# Patient Record
Sex: Male | Born: 1965
Health system: Southern US, Community
[De-identification: ages and names within clinical notes are randomized; demographics above are authoritative.]

## PROBLEM LIST (undated history)

## (undated) DIAGNOSIS — F329 Major depressive disorder, single episode, unspecified: Secondary | ICD-10-CM

## (undated) DIAGNOSIS — K2270 Barrett's esophagus without dysplasia: Secondary | ICD-10-CM

## (undated) DIAGNOSIS — K219 Gastro-esophageal reflux disease without esophagitis: Secondary | ICD-10-CM

## (undated) DIAGNOSIS — D72829 Elevated white blood cell count, unspecified: Secondary | ICD-10-CM

## (undated) DIAGNOSIS — I2510 Atherosclerotic heart disease of native coronary artery without angina pectoris: Secondary | ICD-10-CM

## (undated) DIAGNOSIS — E785 Hyperlipidemia, unspecified: Secondary | ICD-10-CM

## (undated) DIAGNOSIS — F32A Depression, unspecified: Secondary | ICD-10-CM

## (undated) DIAGNOSIS — J449 Chronic obstructive pulmonary disease, unspecified: Secondary | ICD-10-CM

## (undated) DIAGNOSIS — T7840XA Allergy, unspecified, initial encounter: Secondary | ICD-10-CM

## (undated) DIAGNOSIS — T8859XA Other complications of anesthesia, initial encounter: Secondary | ICD-10-CM

## (undated) HISTORY — DX: Depression, unspecified: F32.A

## (undated) HISTORY — DX: Elevated white blood cell count, unspecified: D72.829

## (undated) HISTORY — DX: Gastro-esophageal reflux disease without esophagitis: K21.9

## (undated) HISTORY — DX: Atherosclerotic heart disease of native coronary artery without angina pectoris: I25.10

## (undated) HISTORY — PX: COLONOSCOPY: SHX174

## (undated) HISTORY — DX: Major depressive disorder, single episode, unspecified: F32.9

## (undated) HISTORY — DX: Barrett's esophagus without dysplasia: K22.70

## (undated) HISTORY — PX: PILONIDAL CYST EXCISION: SHX744

## (undated) HISTORY — DX: Allergy, unspecified, initial encounter: T78.40XA

## (undated) HISTORY — PX: OTHER SURGICAL HISTORY: SHX169

---

## 2007-03-09 ENCOUNTER — Ambulatory Visit: Payer: Self-pay | Admitting: Specialist

## 2007-03-11 ENCOUNTER — Ambulatory Visit: Payer: Self-pay | Admitting: Internal Medicine

## 2007-03-19 ENCOUNTER — Ambulatory Visit: Payer: Self-pay | Admitting: Internal Medicine

## 2007-04-19 ENCOUNTER — Ambulatory Visit: Payer: Self-pay | Admitting: Internal Medicine

## 2008-01-29 ENCOUNTER — Encounter (INDEPENDENT_AMBULATORY_CARE_PROVIDER_SITE_OTHER): Payer: Self-pay | Admitting: *Deleted

## 2008-01-29 ENCOUNTER — Ambulatory Visit (HOSPITAL_COMMUNITY): Admission: RE | Admit: 2008-01-29 | Discharge: 2008-01-29 | Payer: Self-pay | Admitting: *Deleted

## 2008-09-24 ENCOUNTER — Inpatient Hospital Stay (HOSPITAL_COMMUNITY): Admission: AD | Admit: 2008-09-24 | Discharge: 2008-09-27 | Payer: Self-pay | Admitting: Psychiatry

## 2008-09-24 ENCOUNTER — Ambulatory Visit: Payer: Self-pay | Admitting: Psychiatry

## 2008-09-24 ENCOUNTER — Emergency Department (HOSPITAL_COMMUNITY): Admission: EM | Admit: 2008-09-24 | Discharge: 2008-09-24 | Payer: Self-pay | Admitting: Emergency Medicine

## 2008-09-28 ENCOUNTER — Other Ambulatory Visit (HOSPITAL_COMMUNITY): Admission: RE | Admit: 2008-09-28 | Discharge: 2008-11-24 | Payer: Self-pay | Admitting: Psychiatry

## 2008-10-24 ENCOUNTER — Ambulatory Visit (HOSPITAL_COMMUNITY): Admission: RE | Admit: 2008-10-24 | Discharge: 2008-10-24 | Payer: Self-pay | Admitting: Chiropractic Medicine

## 2011-03-04 LAB — URINE DRUGS OF ABUSE SCREEN W ALC, ROUTINE (REF LAB)
Amphetamine Screen, Ur: NEGATIVE
Ethyl Alcohol: <10 mg/dL (ref <10)
Marijuana Metabolite: NEGATIVE
Opiate Screen, Urine: NEGATIVE
Propoxyphene: NEGATIVE

## 2011-04-02 NOTE — Op Note (Signed)
NAME:  Gregory Wagner, Gregory Wagner NO.:  [id number]   MEDICAL RECORD NO.:  [id number]          PATIENT TYPE:  AMB   LOCATION:  ENDO                         FACILITY:  [facility]   PHYSICIAN:  Georgiana Spinner, M.D.    DATE OF BIRTH:  1966/10/09   DATE OF PROCEDURE:  01/29/2008  DATE OF DISCHARGE:                               OPERATIVE REPORT   PROCEDURE:  Colonoscopy.   INDICATIONS:  Rectal bleeding.   ANESTHESIA:  Demerol 20 mg, Versed 4 mg.   PROCEDURE:  With the patient mildly sedated in the left lateral  decubitus position, a rectal exam was performed which was unremarkable.  Sphincter was slightly lax, but subsequently the Pentax videoscopic  colonoscope was inserted into the rectum, passed under direct vision  with pressure applied to reach the cecum, identified by ileocecal valve  and appendiceal orifice, both of which were photographed.  Prep was  good.  From this point, the colonoscope was slowly withdrawn, taking  circumferential views of colonic mucosa, stopping only in the rectum  which appeared normal on direct and showed hemorrhoids on retroflexed  view.  The endoscope was straightened and withdrawn.  The patient's  vital signs and pulse oximeter remained stable.  The patient tolerated  the procedure well without apparent complications.   FINDINGS:  Internal hemorrhoids.  Otherwise unremarkable colonoscopic  examination to the cecum.   PLAN:  See endoscopy note for further follow-up.           ______________________________  Georgiana Spinner, M.D.     GMO/MEDQ  D:  01/29/2008  T:  01/30/2008  Job:  [id number]

## 2011-04-02 NOTE — Op Note (Signed)
NAME:  KEINO, PLACENCIA NO.:  [id number]   MEDICAL RECORD NO.:  [id number]          PATIENT TYPE:  AMB   LOCATION:  ENDO                         FACILITY:  [facility]   PHYSICIAN:  Georgiana Spinner, M.D.    DATE OF BIRTH:  10/19/1966   DATE OF PROCEDURE:  01/29/2008  DATE OF DISCHARGE:                               OPERATIVE REPORT   PROCEDURE:  Upper endoscopy.   INDICATIONS:  GERD.   ANESTHESIA:  Demerol 80, Versed 8 mg.   DESCRIPTION OF PROCEDURE:  With the patient mildly sedated in the left  lateral decubitus position, the Pentax videoscopic endoscope was  inserted into the mouth and passed under direct vision through the  esophagus which appeared normal until we reached the distal esophagus  and they were changes to esophagitis with ulceration, and possibly  Barrett's esophagus seen as well.  Photographs and multiple biopsies  were taken.  We then entered into the stomach and fundus, body, antrum,  duodenal bulb, and second portion duodenum were all visualized.  From  this point the endoscope was slowly withdrawn taking circumferential  views of duodenal mucosa until the endoscope had been pulled back, and  the stomach placed in retroflexion to view the stomach from below.   The endoscope was straightened and withdrawn taking circumferential  views of the remaining gastric and esophageal mucosa.  The patient's  vital signs and pulse oximeter remained stable.  The patient tolerated  the procedure well without apparent complications.   FINDINGS:  1. Esophagitis and/or Barrett's esophagus, above photographed and      biopsied.  2. Loose wrap of the gastroesophageal junction around the endoscope      indicating laxity of the lower esophageal sphincter.   PLAN:  Await biopsy report.  The patient will call me for results and  follow up with me as an outpatient.           ______________________________  Georgiana Spinner, M.D.     GMO/MEDQ  D:  01/29/2008  T:   01/30/2008  Job:  [id number]

## 2011-04-02 NOTE — Discharge Summary (Signed)
NAME:  Gregory Wagner, Gregory Wagner NO.:  [id number]   MEDICAL RECORD NO.:  [id number]          PATIENT TYPE:  IPS   LOCATION:  0502                          [facility]ACILITY:  [facility]   PHYSICIAN:  Gregory Wagner, M.D.      DATE OF BIRTH:  September 03, 1966   DATE OF ADMISSION:  09/24/2008  DATE OF DISCHARGE:  09/27/2008                               DISCHARGE SUMMARY   CHIEF COMPLAINT AND PRESENTING ILLNESS:  This was the first admission to  [facility] for this 46 year old single white male who  had been drinking heavily since he had a chemical peel last Wednesday.  Presented to the emergency room at [facility] requesting detox.  He  reported he has been suicidal.  He has had never been to rehab, has been  vomiting, and he is afraid that vomiting is going to ruin the internal  stitches that he had in conjunction with his chemical peel.   PAST PSYCHIATRIC HISTORY:  He had no prior inpatient treatment.  He had  been in outpatient care for 18 months with Dr. Evelene Croon.   ALCOHOL AND DRUG HISTORY:  Has been drinking a fifth of liquor daily and  two bottles of wine.   MEDICAL HISTORY:  Status post chemical peel and some facial surgery.  He  has Barrett's esophagus.   MEDICATIONS:  1. Xanax 1-3 mg per day.  2. Trazodone 150 mg at night.  3. Zoloft 50 mg per day.  4. Omeprazole 20 mg per day.  5. Wellbutrin 450 mg per day.  6. Cephalexin 250 mg 4 times a day.  7. Acyclovir 200 mg 4 times a day.   PHYSICAL EXAMINATION:  Failed to show any acute findings other than the  erythematous skin secondary to the peeling.   LABORATORY WORK:  UDS positive for benzodiazepines.  Alcohol level 280.  White blood cells 13.1.   MENTAL STATUS EXAMINATION:  Reveals an alert cooperative male,  appropriately groomed, dressed and nourished.  Good eye contact.  Speech  was not pressured.  Mood was depressed.  Affect was depressed.  Thought  processes were logical, coherent and relevant.  Endorsed  feeling  overwhelmed with everything going on, stress from work and his use of  alcohol, wanting to get his life back together.  No active suicidal  ideas, no hallucinations, no delusions.  Cognition well-preserved.   ADMISSION DIAGNOSES:  AXIS I:  Alcohol dependence, major depressive  disorder.  AXIS II:  No diagnosis.  AXIS III:  Barrett's esophagus, status post chemical peel.  AXIS IV:  Moderate.  AXIS V:  On admission 35; highest GAF in the last year 60.   COURSE IN THE HOSPITAL:  He was admitted.  He was started in individual  and group psychotherapy.  He was initially detoxed with Librium.  He was  maintained on the Wellbutrin, the Zoloft, and the trazodone.  He  endorsed that alcohol caused a nervous breakdown, severe drinking  binges, no prior treatment.  Started drinking compulsively for the last  2 months.  Endorsed stressors.  Family members moved in.  He  is in a  very stressful job.  Does not have structure.  Strong family history of  alcohol dependence.  The detox went uneventfully.  On November 10, he  was in full contact with reality.  He was denying any active suicidal  ideas.  No hallucinations or delusions.  Was willing and motivated to  pursue outpatient treatment.  He was going to be active in our [facility] CDIOP program.  Was going to continue the  antidepressants.  He was detoxed from benzodiazepines and alcohol.   DISCHARGE DIAGNOSES:  AXIS I:  Alcohol dependence, major depressive  disorder.  AXIS II:  No diagnosis.  AXIS III:  Barrett's esophagus, status post chemical peel.  AXIS IV:  Moderate.  AXIS V:  On discharge 50.   Discharged on:  1. Trazodone 150 mg at bedtime.  2. Protonix 40 mg per day.  3. Wellbutrin XL 150 three in the morning.  4. Keflex 250 mg 4 times a day.  5. Zovirax 200 mg 4 times a day.  6. Librium 25 one at 5:00 p.m. November 10, then Librium 25 one in the      morning November 11, then discontinue.   FOLLOW UP:   CDIOP [facility] and then Dr. Milagros Evener  once discharged from the program.      Gregory Wagner, M.D.  Electronically Signed     IL/MEDQ  D:  10/26/2008  T:  10/27/2008  Job:  [id number]

## 2011-04-02 NOTE — H&P (Signed)
NAME:  Gregory Wagner, Gregory Wagner NO.:  [id number]   MEDICAL RECORD NO.:  [id number]          PATIENT TYPE:  IPS   LOCATION:  0502                          FACILITY:  [facility]   PHYSICIAN:  Geoffery Lyons, M.D.      DATE OF BIRTH:  1965/12/31   DATE OF ADMISSION:  09/24/2008  DATE OF DISCHARGE:                       PSYCHIATRIC ADMISSION ASSESSMENT   This is a voluntary admission to the services of Dr. Geoffery Lyons.   This is a 45 year old single white male.  He had a chemical peel  Wednesday.  He has been drinking heavily since then.  He presented to  the emergency room at [facility] requesting detox.  He reported that he  was suicidal, that he has never been to rehab.  He has been vomiting and  he is afraid that the vomiting is going to ruin the internal stitches  that he had in conjunction with his chemical peel.   PAST PSYCHIATRIC HISTORY:  He has had no prior inpatient care.  He has  been in outpatient care for approximately 18 months with Dr. Milagros Evener.   SOCIAL HISTORY:  He has an associates degree in nursing.  He has a BS in  Heritage manager.  He cohabitated 9 years with a male but that  relationship has finished.  He currently lives alone.  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   FAMILY HISTORY:  He reports his father died at age 31 from alcoholism.  He also had untreated depression and anxiety.  His mother also had a  depression and anxiety but no alcoholism.   ALCOHOL AND DRUG HISTORY:  He smokes 2 packs of cigarettes per day since  his 56s.  He has been drinking a fifth of liquor daily and 2 bottles of  wine.   MEDICAL HISTORY AND PRIMARY CARE Gregory Wagner:  Dr. Renne Crigler.  He has a  therapist, Dr. Andrey Campanile, and he is followed for med management by Dr.  Milagros Evener.   MEDICAL PROBLEMS:  He is known to have a Barrett  esophagus.   MEDICATIONS:  His currently prescribed medications are:  1. Xanax 1 to 3 mg p.o. daily.  2. Trazodone 150 mg at h.s.  3. Zoloft, he is not sure of the dosage and [facility] is not open yet.  4. Omeprazole 20 mg p.o. daily.  5. Wellbutrin 450 mg p.o. daily.  6. Cephalexin 250 mg p.o. q.i.d.  7. Acyclovir 200 mg q.i.d., this is to treat his chemical peel that he      had last Wednesday.   DRUG ALLERGIES:  1. CORTISONE.  2. SULFA.  3. SOLU-MEDROL.  4. HE CLAIMS TO HAVE HAD A DEPLETION OF ALL OF HIS DOPAMINE FROM      TAKING CHANTIX.  HE STATES THAT OVER 14 MONTHS AGO HE WAS USING      CHANTIX TO STOP SMOKING.  HE HAD AN UNUSUAL REACTION, HE BECAME  SUICIDAL.  THE CHANTIX WAS STOPPED, HOWEVER, HE FOUND THAT HE HAD      TO TAKE WELLBUTRIN, NO ONE WOULD PRESCRIBE IT, AND HE ORDERED IT      OFF THE INTERNET REFILLING HIS DOPAMINE LEVELS.   POSITIVE PHYSICAL FINDINGS:  His alcohol level was 280.  His WBC was  13.1.  His UDS was positive for benzos, however, he is prescribed.  His  other lab work was within normal limits.  He is 71 inches tall.  He  weighs 164.  His temperature is 97.7.  Blood pressure was 134/83 to  128/85.  Pulse was 73 and respirations 20.   MENTAL STATUS EXAM:  He was alert and oriented.  He was appropriately  groomed, dressed, and nourished.  He walks unaided.  He had good eye  contact.  He does have ointment applied to his face which is slightly  puffy from the chemical peel.  His speech was not pressured.  His mood  was appropriate to the situation.  His affect had a decreased range due  to his facial puffiness.  Thought processes are clear, rational, and  goal oriented.  He wants to get to detoxed and not be abusive of  alcohol.  Judgment and insight are good.  Concentration and memory are  intact.  Intelligence is average.  He denies being homicidal.  He is no  longer suicidal but he is overwhelmed.  He never had auditory or visual   hallucinations.   AXIS I:  1. Alcohol dependence.  2. Depressive disorders, NOS.  3. He reports suicidality developing from Chantix in August of 2008.  AXIS II:  Homosexual sexual orientation.  AXIS III:  Barrett esophagus.  AXIS IV:  Severe, problems with primary support group, occupational  issues.  AXIS V:  32.   PLAN:  Admit for detox.  Toward that end, he was started on the low dose  Librium protocol.  He states that he has been on the Wellbutrin for  awhile.  He is not sure what dosage of Zoloft he is on.  We discussed  Campral as an augmentation to help him not crave alcohol but he is very  hesitant due to his severe drug reactions.  He does have an outside  psychiatrist and therapist and will be exposed to AA and ways to become  active with AA while in the hospital.   ESTIMATED LENGTH OF STAY:  Three to 5 days.      Mickie Leonarda Salon, P.A.-C.      Geoffery Lyons, M.D.  Electronically Signed    MD/MEDQ  D:  09/25/2008  T:  09/25/2008  Job:  [id number]

## 2011-08-20 LAB — URINALYSIS, ROUTINE W REFLEX MICROSCOPIC
Nitrite: NEGATIVE
Protein, ur: NEGATIVE
Specific Gravity, Urine: 1.014
Urobilinogen, UA: 0.2

## 2011-08-20 LAB — URINE DRUGS OF ABUSE SCREEN W ALC, ROUTINE (REF LAB)
Amphetamine Screen, Ur: NEGATIVE
Amphetamine Screen, Ur: NEGATIVE
Creatinine,U: 19.5 mg/dL
Creatinine,U: 29.5 mg/dL
Ethyl Alcohol: <10 mg/dL (ref <10)
Ethyl Alcohol: <10 mg/dL (ref <10)
Marijuana Metabolite: NEGATIVE
Marijuana Metabolite: NEGATIVE
Opiate Screen, Urine: NEGATIVE
Opiate Screen, Urine: NEGATIVE

## 2011-08-20 LAB — COMPREHENSIVE METABOLIC PANEL
ALT: 18
AST: 23
Alkaline Phosphatase: 83
CO2: 28
Calcium: 9
Chloride: 97
GFR calc non Af Amer: >60
Glucose, Bld: 98
Sodium: 136
Total Bilirubin: 0.6

## 2011-08-20 LAB — DIFFERENTIAL
Basophils Absolute: 0
Basophils Relative: 0
Eosinophils Absolute: 0.1
Eosinophils Relative: 1
Lymphs Abs: 1.6
Neutrophils Relative %: 83 — ABNORMAL HIGH

## 2011-08-20 LAB — RAPID URINE DRUG SCREEN, HOSP PERFORMED
Barbiturates: NOT DETECTED
Opiates: NOT DETECTED
Tetrahydrocannabinol: NOT DETECTED

## 2011-08-20 LAB — TSH: TSH: 0.693

## 2011-08-20 LAB — CBC
Hemoglobin: 16.2
MCHC: 34.7
RBC: 4.98
WBC: 13.1 — ABNORMAL HIGH

## 2011-08-23 LAB — URINE DRUGS OF ABUSE SCREEN W ALC, ROUTINE (REF LAB)
Barbiturate Quant, Ur: NEGATIVE
Barbiturate Quant, Ur: NEGATIVE
Barbiturate Quant, Ur: NEGATIVE
Benzodiazepines.: NEGATIVE
Benzodiazepines.: NEGATIVE
Benzodiazepines.: NEGATIVE
Benzodiazepines.: NEGATIVE
Cocaine Metabolites: NEGATIVE
Cocaine Metabolites: NEGATIVE
Cocaine Metabolites: NEGATIVE
Cocaine Metabolites: NEGATIVE
Creatinine,U: 24.8 mg/dL
Creatinine,U: 28.3 mg/dL
Creatinine,U: 30.5 mg/dL
Creatinine,U: 33.5 mg/dL
Ethyl Alcohol: <10 mg/dL (ref <10)
Ethyl Alcohol: <10 mg/dL (ref <10)
Ethyl Alcohol: <10 mg/dL (ref <10)
Ethyl Alcohol: <10 mg/dL (ref <10)
Methadone: NEGATIVE
Opiate Screen, Urine: NEGATIVE
Phencyclidine (PCP): NEGATIVE
Phencyclidine (PCP): NEGATIVE
Phencyclidine (PCP): NEGATIVE
Phencyclidine (PCP): NEGATIVE

## 2013-06-29 ENCOUNTER — Encounter: Payer: Self-pay | Admitting: Family Medicine

## 2013-06-29 ENCOUNTER — Ambulatory Visit (INDEPENDENT_AMBULATORY_CARE_PROVIDER_SITE_OTHER): Payer: BC Managed Care – PPO | Admitting: Family Medicine

## 2013-06-29 VITALS — BP 130/82 | HR 80 | Temp 98.0°F | Ht 68.5 in | Wt 170.0 lb

## 2013-06-29 DIAGNOSIS — K2270 Barrett's esophagus without dysplasia: Secondary | ICD-10-CM

## 2013-06-29 DIAGNOSIS — F329 Major depressive disorder, single episode, unspecified: Secondary | ICD-10-CM

## 2013-06-29 DIAGNOSIS — F3289 Other specified depressive episodes: Secondary | ICD-10-CM

## 2013-06-29 DIAGNOSIS — F32A Depression, unspecified: Secondary | ICD-10-CM

## 2013-06-29 DIAGNOSIS — Z23 Encounter for immunization: Secondary | ICD-10-CM

## 2013-06-29 NOTE — Patient Instructions (Addendum)
Take care.  Call with questions.  We'll request your records.  Glad to see you.

## 2013-06-30 ENCOUNTER — Encounter: Payer: Self-pay | Admitting: Family Medicine

## 2013-06-30 DIAGNOSIS — F32A Depression, unspecified: Secondary | ICD-10-CM | POA: Insufficient documentation

## 2013-06-30 DIAGNOSIS — Z23 Encounter for immunization: Secondary | ICD-10-CM | POA: Insufficient documentation

## 2013-06-30 DIAGNOSIS — K2270 Barrett's esophagus without dysplasia: Secondary | ICD-10-CM | POA: Insufficient documentation

## 2013-06-30 DIAGNOSIS — F329 Major depressive disorder, single episode, unspecified: Secondary | ICD-10-CM | POA: Insufficient documentation

## 2013-06-30 DIAGNOSIS — F418 Other specified anxiety disorders: Secondary | ICD-10-CM | POA: Insufficient documentation

## 2013-06-30 NOTE — Assessment & Plan Note (Signed)
Done today.

## 2013-06-30 NOTE — Assessment & Plan Note (Signed)
Per psych, mood stable, doing well.

## 2013-06-30 NOTE — Assessment & Plan Note (Signed)
Per GI, no new sx, doing well.  Requesting records from all MDs.

## 2013-06-30 NOTE — Progress Notes (Signed)
New patient.  Requesting records from Dr. Evelene Croon, Dr. Elnoria Howard, and Dr. Renne Crigler.   Depression, worse after tx with chantix.  No SI/HI.  Followed by Dr. Evelene Croon.  Doing well with current meds.  No illicits.    H/o barrett's followed by Dr. Elnoria Howard and doing well with PPI qd.  No new sx.  Will have routine f/u with GI in 2015 for endoscopy.   Prev PCP records requested.  Due for tdap per patient.  Feels well o/w.    Labs recently done at psych clinic.    PMH and SH reviewed  ROS: See HPI, otherwise noncontributory.  Meds, vitals, and allergies reviewed.   GEN: nad, alert and oriented HEENT: mucous membranes moist NECK: supple w/o LA CV: rrr.  no murmur PULM: ctab, no inc wob ABD: soft, +bs EXT: no edema SKIN: no acute rash

## 2013-10-19 ENCOUNTER — Ambulatory Visit: Payer: BC Managed Care – PPO | Admitting: Family Medicine

## 2014-07-29 ENCOUNTER — Ambulatory Visit (INDEPENDENT_AMBULATORY_CARE_PROVIDER_SITE_OTHER): Payer: BC Managed Care – PPO | Admitting: Family Medicine

## 2014-07-29 ENCOUNTER — Encounter: Payer: Self-pay | Admitting: Family Medicine

## 2014-07-29 VITALS — BP 128/90 | HR 74 | Temp 97.8°F | Ht 69.0 in | Wt 179.5 lb

## 2014-07-29 DIAGNOSIS — R0789 Other chest pain: Secondary | ICD-10-CM

## 2014-07-29 DIAGNOSIS — Z7189 Other specified counseling: Secondary | ICD-10-CM

## 2014-07-29 DIAGNOSIS — F17200 Nicotine dependence, unspecified, uncomplicated: Secondary | ICD-10-CM

## 2014-07-29 DIAGNOSIS — Z0000 Encounter for general adult medical examination without abnormal findings: Secondary | ICD-10-CM

## 2014-07-29 DIAGNOSIS — Z8249 Family history of ischemic heart disease and other diseases of the circulatory system: Secondary | ICD-10-CM

## 2014-07-29 DIAGNOSIS — K2270 Barrett's esophagus without dysplasia: Secondary | ICD-10-CM

## 2014-07-29 LAB — COMPREHENSIVE METABOLIC PANEL
ALT: 16 U/L (ref 0–53)
AST: 16 U/L (ref 0–37)
Albumin: 4.2 g/dL (ref 3.5–5.2)
Alkaline Phosphatase: 74 U/L (ref 39–117)
BUN: 8 mg/dL (ref 6–23)
CALCIUM: 9.5 mg/dL (ref 8.4–10.5)
CHLORIDE: 100 meq/L (ref 96–112)
CO2: 29 mEq/L (ref 19–32)
Creatinine, Ser: 1 mg/dL (ref 0.4–1.5)
GFR: 83.17 mL/min (ref >60.00)
GLUCOSE: 91 mg/dL (ref 70–99)
Potassium: 4.2 mEq/L (ref 3.5–5.1)
Sodium: 137 mEq/L (ref 135–145)
Total Bilirubin: 0.5 mg/dL (ref 0.2–1.2)
Total Protein: 7.4 g/dL (ref 6.0–8.3)

## 2014-07-29 LAB — LIPID PANEL
CHOL/HDL RATIO: 3
Cholesterol: 173 mg/dL (ref 0–200)
HDL: 66.3 mg/dL (ref >39.00)
LDL Cholesterol: 94 mg/dL (ref 0–99)
NonHDL: 106.7
TRIGLYCERIDES: 63 mg/dL (ref 0.0–149.0)
VLDL: 12.6 mg/dL (ref 0.0–40.0)

## 2014-07-29 NOTE — Patient Instructions (Addendum)
Call Dr. Elnoria Howard about an appointment.  Let me know if you need a referral.   Go to the lab on the way out.  We'll contact you with your lab report. We'll be in touch. Take care.

## 2014-07-29 NOTE — Progress Notes (Signed)
Pre visit review using our clinic review tool, if applicable. No additional management support is needed unless otherwise documented below in the visit note.  CPE- See plan.  Routine anticipatory guidance given to patient.  See health maintenance. Tetanus 2014 Flu shot to be done at pharmacy PNA 2013 Colonoscopy prev done.   PSA not due yet.  D/w pt.   Living will d/w pt.  Would have sister designated if patient were incapacitated.   Smoking.  Up to 2 PPD.  Inc with work stress.  See below.  Etoh.  With more smoking.   H/o etoh tx prev, IOP prev.  He has plans to taper back.  He has good insight.    Atypical chest pain, not with exertion. Pinpoint burning intermittently in the chest.  Lasts up to 1-2 days. Going on for about 1 year intermittently, not getting worse.  On PPI already.   Early FH CAD noted, but no personal hx noted.   Dry cough noted.  Smoking noted.   D/w pt.    PMH and SH reviewed  Meds, vitals, and allergies reviewed.   ROS: See HPI.  Otherwise negative.    GEN: nad, alert and oriented HEENT: mucous membranes moist NECK: supple w/o LA CV: rrr. PULM: ctab, no inc wob ABD: soft, +bs EXT: no edema SKIN: no acute rash

## 2014-07-31 ENCOUNTER — Telehealth: Payer: Self-pay | Admitting: Family Medicine

## 2014-07-31 DIAGNOSIS — R0789 Other chest pain: Secondary | ICD-10-CM | POA: Insufficient documentation

## 2014-07-31 DIAGNOSIS — Z7189 Other specified counseling: Secondary | ICD-10-CM | POA: Insufficient documentation

## 2014-07-31 DIAGNOSIS — F17200 Nicotine dependence, unspecified, uncomplicated: Secondary | ICD-10-CM | POA: Insufficient documentation

## 2014-07-31 DIAGNOSIS — Z0000 Encounter for general adult medical examination without abnormal findings: Secondary | ICD-10-CM | POA: Insufficient documentation

## 2014-07-31 DIAGNOSIS — Z87891 Personal history of nicotine dependence: Secondary | ICD-10-CM

## 2014-07-31 NOTE — Assessment & Plan Note (Signed)
EKG unremarkable.  D/w pt.  Likely not cardiac.  This could be GI in origin.  He'll check on f/u GI clinic.

## 2014-07-31 NOTE — Telephone Encounter (Signed)
Notify pt.  He asked about CT scanning for lung cancer screening.  He may meet the criteria since he has a 40+ pack year hx.  He may want to check on insurance coverage first.  Either way, let me know and I'll put in the order.  We'll need to know when he'll be in town for scheduling.  Thanks.

## 2014-07-31 NOTE — Assessment & Plan Note (Addendum)
He asked about CT scan for lung CA screening.  See following phone note.  D/w pt about smoking cessation.  He has plans to cease both etoh and tobacco.  Encouraged.

## 2014-07-31 NOTE — Assessment & Plan Note (Signed)
He'll check on f/u with GI.

## 2014-07-31 NOTE — Assessment & Plan Note (Signed)
Routine anticipatory guidance given to patient. See health maintenance.  Tetanus 2014  Flu shot to be done at pharmacy  PNA 2013  Colonoscopy prev done.  PSA not due yet. D/w pt.  Living will d/w pt. Would have sister designated if patient were incapacitated.  Smoking. Up to 2 PPD. Inc with work stress. See below.  Etoh. With more smoking. H/o etoh tx prev, IOP prev. He has plans to taper back. He has good insight.

## 2014-08-02 NOTE — Telephone Encounter (Signed)
Left vm for pt to return call  

## 2014-08-03 NOTE — Telephone Encounter (Signed)
Left message at home number to call back. Was advised that he is out of town.

## 2014-09-15 NOTE — Telephone Encounter (Signed)
Ordered. Thanks

## 2014-09-15 NOTE — Addendum Note (Signed)
Addended by: Joaquim NamUNCAN, Danyeal Akens S on: 09/15/2014 10:57 PM   Modules accepted: Orders

## 2014-09-15 NOTE — Telephone Encounter (Signed)
Pt left v/m; pt tried to get his own authorization for CT of lungs or chest;pt is [facility] [employer] but pt was told doctors office would have to call for authorization for CT. Pt request cb.

## 2014-09-23 ENCOUNTER — Ambulatory Visit (INDEPENDENT_AMBULATORY_CARE_PROVIDER_SITE_OTHER)
Admission: RE | Admit: 2014-09-23 | Discharge: 2014-09-23 | Disposition: A | Discharge status: Home / Self Care | Payer: Self-pay | Source: Ambulatory Visit | Attending: Family Medicine | Admitting: Family Medicine

## 2014-09-23 DIAGNOSIS — Z87891 Personal history of nicotine dependence: Secondary | ICD-10-CM

## 2014-09-25 ENCOUNTER — Encounter: Payer: Self-pay | Admitting: Family Medicine

## 2014-09-25 DIAGNOSIS — R918 Other nonspecific abnormal finding of lung field: Secondary | ICD-10-CM | POA: Insufficient documentation

## 2014-10-06 ENCOUNTER — Ambulatory Visit (INDEPENDENT_AMBULATORY_CARE_PROVIDER_SITE_OTHER): Payer: BC Managed Care – PPO | Admitting: Family Medicine

## 2014-10-06 ENCOUNTER — Encounter: Payer: Self-pay | Admitting: Family Medicine

## 2014-10-06 VITALS — BP 122/84 | HR 86 | Temp 98.3°F | Wt 179.2 lb

## 2014-10-06 DIAGNOSIS — R918 Other nonspecific abnormal finding of lung field: Secondary | ICD-10-CM

## 2014-10-06 DIAGNOSIS — B001 Herpesviral vesicular dermatitis: Secondary | ICD-10-CM

## 2014-10-06 DIAGNOSIS — N529 Male erectile dysfunction, unspecified: Secondary | ICD-10-CM

## 2014-10-06 MED ORDER — SILDENAFIL CITRATE 20 MG PO TABS: 20.0000 mg | ORAL_TABLET | Freq: Every day | ORAL | Status: DC | PRN

## 2014-10-06 MED ORDER — VALACYCLOVIR HCL 1 G PO TABS: 2000.0000 mg | ORAL_TABLET | Freq: Two times a day (BID) | ORAL | Status: DC

## 2014-10-06 NOTE — Progress Notes (Signed)
Pre visit review using our clinic review tool, if applicable. No additional management support is needed unless otherwise documented below in the visit note.  He got married this past weekend.  I congratulated him.   Pulmonary nodules on CT. CT d/w pt in detail. He is going to stop smoking, has a plan to use E cig.  D/w pt.    ED. +effect with viagra prev. Asking for a refill.    Cold sore recently noted on upper lip. Asking about tx for next episode.   Meds, vitals, and allergies reviewed.   ROS: See HPI.  Otherwise, noncontributory.  nad ncat Resolving cold sore on upper lip noted.  Remainder of exam deferred.

## 2014-10-06 NOTE — Patient Instructions (Signed)
Check on the sildenafil pricing.  Use valtrex if needed.  We'll recheck your CT in 1 year.  Take care.  Congrats.

## 2014-10-07 ENCOUNTER — Telehealth: Payer: Self-pay | Admitting: Family Medicine

## 2014-10-07 DIAGNOSIS — B001 Herpesviral vesicular dermatitis: Secondary | ICD-10-CM | POA: Insufficient documentation

## 2014-10-07 DIAGNOSIS — N529 Male erectile dysfunction, unspecified: Secondary | ICD-10-CM | POA: Insufficient documentation

## 2014-10-07 HISTORY — DX: Herpesviral vesicular dermatitis: B00.1

## 2014-10-07 NOTE — Assessment & Plan Note (Signed)
D/w pt.  No cancerous lesions found, with small nodules noted would be reasonable to repeat CT in year.   General aspects of pulmonary nodules d/w pt, esp related to size.  With his small lesions, okay for CT in 1 year.  Main issue is to stop smoking.  He has no pulmonary sx, and since he is getting ready to stop smoking, we didn't pursue pulmonary function testing.  He agrees.  >15 minutes spent in face to face time with patient, >50% spent in counselling or coordination of care.

## 2014-10-07 NOTE — Assessment & Plan Note (Signed)
Can use prn valtrex.  Fu prn.

## 2014-10-07 NOTE — Telephone Encounter (Signed)
emmi emailed °

## 2014-10-07 NOTE — Assessment & Plan Note (Signed)
He'll work on smoking cessation and will use prn viagra.

## 2014-11-07 ENCOUNTER — Telehealth: Payer: Self-pay

## 2014-11-07 NOTE — Telephone Encounter (Signed)
Pt left v/m; pt has new job and new employer is requiring a letter that pt does not have communicable diseases. 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 Pt wants to know if can write letter from [facility] done 07/29/14. Please advise.

## 2014-11-08 ENCOUNTER — Encounter: Payer: Self-pay | Admitting: *Deleted

## 2014-11-08 NOTE — Telephone Encounter (Signed)
Please send a letter stating that patient had a physical on 07/29/14 and has no known communicable diseases.  Please put in the letter that the patient can provide his TB test from the outside hospital that was done in 05/2014. Thanks .

## 2014-11-08 NOTE — Telephone Encounter (Signed)
Letter done and patient notified

## 2014-11-09 ENCOUNTER — Encounter (INDEPENDENT_AMBULATORY_CARE_PROVIDER_SITE_OTHER): Payer: Self-pay

## 2015-09-24 ENCOUNTER — Other Ambulatory Visit: Payer: Self-pay | Admitting: Family Medicine

## 2015-09-24 DIAGNOSIS — R918 Other nonspecific abnormal finding of lung field: Secondary | ICD-10-CM

## 2015-09-24 NOTE — Progress Notes (Signed)
Please call pt.  Due for f/u chest CT re: pulmonary nodules.  I put in the order.  Thanks.

## 2015-09-25 NOTE — Progress Notes (Signed)
Patient notified as instructed by telephone and verbalized understanding. Patient stated that he has already talked to the referral coordinator about this and they are working on getting it scheduled..Marland Kitchen

## 2015-10-05 ENCOUNTER — Ambulatory Visit
Admission: RE | Admit: 2015-10-05 | Discharge: 2015-10-05 | Disposition: A | Discharge status: Home / Self Care | Payer: BLUE CROSS/BLUE SHIELD | Source: Ambulatory Visit | Attending: Family Medicine | Admitting: Family Medicine

## 2015-10-05 DIAGNOSIS — R918 Other nonspecific abnormal finding of lung field: Secondary | ICD-10-CM

## 2015-10-26 ENCOUNTER — Other Ambulatory Visit: Payer: Self-pay | Admitting: Family Medicine

## 2015-10-26 DIAGNOSIS — Z8249 Family history of ischemic heart disease and other diseases of the circulatory system: Secondary | ICD-10-CM

## 2015-11-03 ENCOUNTER — Other Ambulatory Visit (INDEPENDENT_AMBULATORY_CARE_PROVIDER_SITE_OTHER): Payer: BLUE CROSS/BLUE SHIELD

## 2015-11-03 DIAGNOSIS — Z8249 Family history of ischemic heart disease and other diseases of the circulatory system: Secondary | ICD-10-CM | POA: Diagnosis not present

## 2015-11-03 LAB — LIPID PANEL
Cholesterol: 204 mg/dL — ABNORMAL HIGH (ref 0–200)
HDL: 66.2 mg/dL (ref >39.00)
LDL CALC: 113 mg/dL — AB (ref 0–99)
NONHDL: 137.66
TRIGLYCERIDES: 124 mg/dL (ref 0.0–149.0)
Total CHOL/HDL Ratio: 3
VLDL: 24.8 mg/dL (ref 0.0–40.0)

## 2015-11-03 LAB — BASIC METABOLIC PANEL
BUN: 18 mg/dL (ref 6–23)
CHLORIDE: 101 meq/L (ref 96–112)
CO2: 32 meq/L (ref 19–32)
CREATININE: 1.03 mg/dL (ref 0.40–1.50)
Calcium: 10 mg/dL (ref 8.4–10.5)
GFR: 80.9 mL/min (ref >60.00)
GLUCOSE: 93 mg/dL (ref 70–99)
Potassium: 4.5 mEq/L (ref 3.5–5.1)
Sodium: 138 mEq/L (ref 135–145)

## 2015-11-07 ENCOUNTER — Encounter: Payer: Self-pay | Admitting: Family Medicine

## 2015-11-07 ENCOUNTER — Ambulatory Visit (INDEPENDENT_AMBULATORY_CARE_PROVIDER_SITE_OTHER): Payer: BLUE CROSS/BLUE SHIELD | Admitting: Family Medicine

## 2015-11-07 ENCOUNTER — Other Ambulatory Visit: Payer: Self-pay | Admitting: Family Medicine

## 2015-11-07 VITALS — BP 122/80 | HR 82 | Temp 98.3°F | Ht 69.0 in | Wt 178.0 lb

## 2015-11-07 DIAGNOSIS — F329 Major depressive disorder, single episode, unspecified: Secondary | ICD-10-CM

## 2015-11-07 DIAGNOSIS — Z0000 Encounter for general adult medical examination without abnormal findings: Secondary | ICD-10-CM | POA: Diagnosis not present

## 2015-11-07 DIAGNOSIS — Z23 Encounter for immunization: Secondary | ICD-10-CM | POA: Diagnosis not present

## 2015-11-07 DIAGNOSIS — F32A Depression, unspecified: Secondary | ICD-10-CM

## 2015-11-07 DIAGNOSIS — K2270 Barrett's esophagus without dysplasia: Secondary | ICD-10-CM

## 2015-11-07 DIAGNOSIS — Z119 Encounter for screening for infectious and parasitic diseases, unspecified: Secondary | ICD-10-CM

## 2015-11-07 DIAGNOSIS — R918 Other nonspecific abnormal finding of lung field: Secondary | ICD-10-CM

## 2015-11-07 LAB — HEPATIC FUNCTION PANEL
ALK PHOS: 64 U/L (ref 39–117)
ALT: 15 U/L (ref 0–53)
AST: 15 U/L (ref 0–37)
Albumin: 4.6 g/dL (ref 3.5–5.2)
BILIRUBIN DIRECT: 0.1 mg/dL (ref 0.0–0.3)
BILIRUBIN TOTAL: 0.5 mg/dL (ref 0.2–1.2)
Total Protein: 7.3 g/dL (ref 6.0–8.3)

## 2015-11-07 LAB — HM COLONOSCOPY

## 2015-11-07 NOTE — Progress Notes (Signed)
Pre visit review using our clinic review tool, if applicable. No additional management support is needed unless otherwise documented below in the visit note.  CPE- See plan.  Routine anticipatory guidance given to patient.  See health maintenance. Tetanus 2014  Flu shot 2016  PNA 2013  Colonoscopy prev done per Dr Elnoria HowardHung.   Prostate cancer screening and PSA options (with potential risks and benefits of testing vs not testing) were discussed along with recent recs/guidelines.  He declined testing PSA at this point. Living will d/w pt. Would have sister designated if patient were incapacitated.  Smoking. ~1 PPD. Inc with stressors, he is considering taper in 2017.  Etoh. Noted with smoking. H/o etoh tx prev, IOP prev. He has plans to taper back. He has good insight.  Diet and exercise d/w pt.  Minimal exercise.  "I dropped my good habits".   Pt opts in for HCV screening.  D/w pt re: routine screening.   Needs LFTs done given his psych meds.  D/w pt.   HIV screening d/w pt.  Prev neg per patient ~2013.    He won't be due for f/u CT re: pulmonary nodules until 09/2016.  D/w pt.    PMH and SH reviewed  Meds, vitals, and allergies reviewed.   ROS: See HPI.  Otherwise negative.    GEN: nad, alert and oriented HEENT: mucous membranes moist NECK: supple w/o LA CV: rrr. PULM: ctab, no inc wob ABD: soft, +bs EXT: no edema SKIN: no acute rash

## 2015-11-07 NOTE — Patient Instructions (Signed)
Go to the lab on the way out.  We'll contact you with your lab report. Take care.  Glad to see you.  

## 2015-11-08 ENCOUNTER — Encounter: Payer: Self-pay | Admitting: *Deleted

## 2015-11-08 LAB — HEPATITIS C ANTIBODY: HCV Ab: NEGATIVE

## 2015-11-08 NOTE — Assessment & Plan Note (Signed)
Per psych 

## 2015-11-08 NOTE — Assessment & Plan Note (Signed)
Per GI

## 2015-11-08 NOTE — Assessment & Plan Note (Signed)
Tetanus 2014  Flu shot 2016  PNA 2013  Colonoscopy prev done per Dr Elnoria HowardHung.   Prostate cancer screening and PSA options (with potential risks and benefits of testing vs not testing) were discussed along with recent recs/guidelines.  He declined testing PSA at this point. Living will d/w pt. Would have sister designated if patient were incapacitated.  Smoking. ~1 PPD. Inc with stressors, he is considering taper in 2017.  Etoh. Noted with smoking. H/o etoh tx prev, IOP prev. He has plans to taper back. He has good insight.  Diet and exercise d/w pt.  Minimal exercise.  "I dropped my good habits".   Pt opts in for HCV screening.  D/w pt re: routine screening.   Needs LFTs done given his psych meds.  D/w pt.   HIV screening d/w pt.  Prev neg per patient ~2013.

## 2016-09-19 ENCOUNTER — Other Ambulatory Visit: Payer: Self-pay | Admitting: Family Medicine

## 2016-09-19 DIAGNOSIS — R918 Other nonspecific abnormal finding of lung field: Secondary | ICD-10-CM

## 2016-09-19 NOTE — Progress Notes (Signed)
Call pt.  Due for f/u CT chest re: pulmonary nodules.  Ordered.  Thanks.   Left detailed message on voicemail. 09/19/16 L. [place]qu[place], CMA

## 2016-11-03 ENCOUNTER — Other Ambulatory Visit: Payer: Self-pay | Admitting: Family Medicine

## 2016-11-03 DIAGNOSIS — E785 Hyperlipidemia, unspecified: Secondary | ICD-10-CM

## 2016-11-04 ENCOUNTER — Other Ambulatory Visit (INDEPENDENT_AMBULATORY_CARE_PROVIDER_SITE_OTHER): Payer: BLUE CROSS/BLUE SHIELD

## 2016-11-04 DIAGNOSIS — E785 Hyperlipidemia, unspecified: Secondary | ICD-10-CM | POA: Diagnosis not present

## 2016-11-04 LAB — COMPREHENSIVE METABOLIC PANEL
ALK PHOS: 77 U/L (ref 39–117)
ALT: 21 U/L (ref 0–53)
AST: 18 U/L (ref 0–37)
Albumin: 4.4 g/dL (ref 3.5–5.2)
BUN: 10 mg/dL (ref 6–23)
CO2: 28 meq/L (ref 19–32)
Calcium: 9.6 mg/dL (ref 8.4–10.5)
Chloride: 99 mEq/L (ref 96–112)
Creatinine, Ser: 0.9 mg/dL (ref 0.40–1.50)
GFR: 94.15 mL/min (ref >60.00)
GLUCOSE: 93 mg/dL (ref 70–99)
POTASSIUM: 4.2 meq/L (ref 3.5–5.1)
SODIUM: 136 meq/L (ref 135–145)
TOTAL PROTEIN: 6.9 g/dL (ref 6.0–8.3)
Total Bilirubin: 0.4 mg/dL (ref 0.2–1.2)

## 2016-11-04 LAB — LIPID PANEL
CHOL/HDL RATIO: 2
Cholesterol: 164 mg/dL (ref 0–200)
HDL: 67.7 mg/dL (ref >39.00)
LDL CALC: 81 mg/dL (ref 0–99)
NONHDL: 95.98
Triglycerides: 75 mg/dL (ref 0.0–149.0)
VLDL: 15 mg/dL (ref 0.0–40.0)

## 2016-11-08 ENCOUNTER — Encounter: Payer: BLUE CROSS/BLUE SHIELD | Admitting: Family Medicine

## 2016-11-12 DIAGNOSIS — F3342 Major depressive disorder, recurrent, in full remission: Secondary | ICD-10-CM | POA: Diagnosis not present

## 2016-11-12 DIAGNOSIS — F410 Panic disorder [episodic paroxysmal anxiety] without agoraphobia: Secondary | ICD-10-CM | POA: Diagnosis not present

## 2016-11-13 ENCOUNTER — Encounter: Payer: BLUE CROSS/BLUE SHIELD | Admitting: Family Medicine

## 2016-11-19 ENCOUNTER — Ambulatory Visit (INDEPENDENT_AMBULATORY_CARE_PROVIDER_SITE_OTHER): Payer: BLUE CROSS/BLUE SHIELD | Admitting: Family Medicine

## 2016-11-19 ENCOUNTER — Encounter: Payer: Self-pay | Admitting: Family Medicine

## 2016-11-19 VITALS — BP 132/88 | HR 85 | Temp 98.6°F | Ht 69.0 in | Wt 186.0 lb

## 2016-11-19 DIAGNOSIS — F32A Depression, unspecified: Secondary | ICD-10-CM

## 2016-11-19 DIAGNOSIS — Z0000 Encounter for general adult medical examination without abnormal findings: Secondary | ICD-10-CM

## 2016-11-19 DIAGNOSIS — K2270 Barrett's esophagus without dysplasia: Secondary | ICD-10-CM

## 2016-11-19 DIAGNOSIS — R918 Other nonspecific abnormal finding of lung field: Secondary | ICD-10-CM

## 2016-11-19 DIAGNOSIS — F329 Major depressive disorder, single episode, unspecified: Secondary | ICD-10-CM

## 2016-11-19 MED ORDER — VALACYCLOVIR HCL 1 G PO TABS: 2000.0000 mg | ORAL_TABLET | Freq: Two times a day (BID) | ORAL | 2 refills | Status: DC

## 2016-11-19 NOTE — Progress Notes (Signed)
CPE- See plan.  Routine anticipatory guidance given to patient.  See health maintenance. Tetanus 2014  Flu shot deferred with recent URIs prev. Defer for now PNA 2013  Colonoscopy prev done per Dr Elnoria HowardHung.   Prostate cancer screening and PSA options (with potential risks and benefits of testing vs not testing) were discussed along with recent recs/guidelines.  He declined testing PSA at this point. Living will d/w pt. Would have sister designated if patient were incapacitated.  Smoking. ~1 PPD. Inc with stressors, he is considering taper in 2018.  Etoh. Noted with smoking. H/o etoh tx prev.  He has to tapered. He has good insight.  Diet and exercise d/w pt.  Minimal exercise. He has made resolutions for this year HCV screening prev done.   HIV screening d/w pt.  Prev neg per patient ~2013.   Mood d/w pt.  Per psych.    PMH and SH reviewed  Meds, vitals, and allergies reviewed.   ROS: Per HPI.  Unless specifically indicated otherwise in HPI, the patient denies:  General: fever. Eyes: acute vision changes ENT: sore throat Cardiovascular: chest pain Respiratory: SOB GI: vomiting GU: dysuria Musculoskeletal: acute back pain Derm: acute rash Neuro: acute motor dysfunction Psych: worsening mood Endocrine: polydipsia Heme: bleeding Allergy: hayfever  GEN: nad, alert and oriented HEENT: mucous membranes moist NECK: supple w/o LA CV: rrr. PULM: ctab, no inc wob ABD: soft, +bs EXT: no edema SKIN: no acute rash

## 2016-11-19 NOTE — Progress Notes (Signed)
Pre visit review using our clinic review tool, if applicable. No additional management support is needed unless otherwise documented below in the visit note. 

## 2016-11-19 NOTE — Patient Instructions (Signed)
Take care.  Glad to see you. Update me as needed.  

## 2016-11-20 NOTE — Assessment & Plan Note (Signed)
Continue omeprazole. No adverse effect on medication. He agrees.

## 2016-11-20 NOTE — Assessment & Plan Note (Signed)
Tetanus 2014  Flu shot deferred with recent URIs prev. Defer for now PNA 2013  Colonoscopy prev done per Dr Elnoria HowardHung.   Prostate cancer screening and PSA options (with potential risks and benefits of testing vs not testing) were discussed along with recent recs/guidelines.  He declined testing PSA at this point. Living will d/w pt. Would have sister designated if patient were incapacitated.  Smoking. ~1 PPD. Inc with stressors, he is considering taper in 2018.  Etoh. Noted with smoking. H/o etoh tx prev.  He has to tapered. He has good insight.  Diet and exercise d/w pt.  Minimal exercise. He has made resolutions for this year HCV screening prev done.   HIV screening d/w pt.  Prev neg per patient ~2013.

## 2016-11-20 NOTE — Assessment & Plan Note (Signed)
I copied and pasted the report from his most recent CT in November 2017. This was done in the [facility] system. To my knowledge, this report never came directly back to me. This has now been reported within our system as a safety portal issue, to be addressed.  I apologized to the patient. He understood. He understands that there were no significant new findings on CT and the plan at this point would be to repeat CT again in November 2018. Continue to work on smoking cessation in the meantime. He agrees.   IMPRESSION: 1.No acute findings. 2.Moderate emphysema of the upper lobes. 3.Small bilateral pulmonary nodules which measure up to 5 mm in the right lung, as above. Reference Fleischner criteria guidelines below for follow-up recommendations.    Excerpt from "Guidelines for Management of Incidental Pulmonary Nodules Detected on CT Images: From the Fleischner Society 2017"Radiology 2017; 000:1-16.  Note: Guidelines do not apply to lung cancer screening, patients with immunosuppression, or patients with known primary tumor.  Solid Single Nodule: 5 mm or smaller:  Low Risk: No routine follow-up High Risk: Optional CT at 12 months  6-8 mm:  Low Risk: CT at 6-12 months; then consider CT at 18-24 months. High Risk: CT at 6-12 months; then consider CT at 18-24 months.  9 mm or larger:  Low Risk: Consider CT at 3 months, PET/CT, or tissue sampling High Risk: Consider CT at 3 months, PET/CT, or tissue sampling  (Nodules less than 6 mm do not require routine follow-up, but certain patients at high risk with suspicious nodule morphology, upper lobe location, or both may warrant 12 month follow up).  Multiple Solid Nodules: 5 mm or smaller:  Low Risk: No routine follow-up High Risk: Optional CT at 12 months  6-8 mm:  Low Risk: CT at 3-6 months; then consider CT at 18-24 months. High Risk: CT at 3-6 months; then consider CT at 18-24 months.  9 mm or larger:  Low Risk: CT at 3-6  months; then consider CT at 18-24 months. High Risk: CT at 3-6 months; then consider CT at 18-24 months.  (Use most suspicious nodule as guide to management. Follow-up intervals may vary according to size and risk).  Result Narrative  CT CHEST WO CONTRAST  INDICATION: R91.8: Other nonspecific abnormal finding of lung field   COMPARISON: None.   TECHNIQUE:CT CHEST WO CONTRASTRadiation dose reduction was utilized (automated exposure control, mA or kV adjustment based on patient size, or iterative image reconstruction).  FINDINGS:  Thoracic inlet: Visualized thyroid is unremarkable. The central airways patent. Mild bilateral gynecomastia.  Heart: Heart size is within normal limits. No pericardial effusion.  Vessels: No aneurysm.  Mediastinum/hila: No suspicious adenopathy identified.  Lungs: Moderate centrilobular predominant emphysema which is most pronounced in the upper lobes. 4 x 4 mm nodule in the right upper lobe (series 4, image 32). 5 mm nodule along the right minor fissure (series 4, image 72). Triangular 2-3 mm nodule along  the left major fissure (series 4, image 66). Mild dependent opacities likely relate to atelectasis. No consolidative airspace opacities.  Pleura: No pleural effusion. No pneumothorax.  Upper abdomen: - Low-attenuation focus adjacent to the falciform ligament likely relates to focal fatty infiltration.  Musculoskeletal: - No acute osseous findings. Mild scattered degenerative changes.

## 2016-11-20 NOTE — Assessment & Plan Note (Signed)
Per psychiatry.  Okay for outpatient follow-up.

## 2016-12-11 ENCOUNTER — Telehealth: Payer: Self-pay

## 2016-12-11 MED ORDER — OSELTAMIVIR PHOSPHATE 75 MG PO CAPS: 75.0000 mg | ORAL_CAPSULE | Freq: Two times a day (BID) | ORAL | 0 refills | Status: DC

## 2016-12-11 NOTE — Telephone Encounter (Signed)
Pt left v/m; pt seen 11/19/16 and did not get flu shot due to pt being sick; now [facility] I spoke with pt and he is having extreme weakness,chills,pt knows he has a fever but does not have a thermometer to take temp.non prod cough,S/T. 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 Pt request cb.

## 2016-12-11 NOTE — Telephone Encounter (Signed)
Sent. Thanks.  ?Update us as needed.  ?

## 2016-12-11 NOTE — Telephone Encounter (Signed)
Patient notified by telephone that script has been sent in per his request.

## 2017-01-14 DIAGNOSIS — Z6826 Body mass index (BMI) 26.0-26.9, adult: Secondary | ICD-10-CM | POA: Diagnosis not present

## 2017-01-14 DIAGNOSIS — Z713 Dietary counseling and surveillance: Secondary | ICD-10-CM | POA: Diagnosis not present

## 2017-01-14 DIAGNOSIS — Z136 Encounter for screening for cardiovascular disorders: Secondary | ICD-10-CM | POA: Diagnosis not present

## 2017-01-14 DIAGNOSIS — Z13220 Encounter for screening for lipoid disorders: Secondary | ICD-10-CM | POA: Diagnosis not present

## 2017-01-14 DIAGNOSIS — Z131 Encounter for screening for diabetes mellitus: Secondary | ICD-10-CM | POA: Diagnosis not present

## 2017-05-10 DIAGNOSIS — F410 Panic disorder [episodic paroxysmal anxiety] without agoraphobia: Secondary | ICD-10-CM | POA: Diagnosis not present

## 2017-05-10 DIAGNOSIS — F3342 Major depressive disorder, recurrent, in full remission: Secondary | ICD-10-CM | POA: Diagnosis not present

## 2017-07-04 ENCOUNTER — Encounter (INDEPENDENT_AMBULATORY_CARE_PROVIDER_SITE_OTHER): Payer: Self-pay

## 2017-07-04 ENCOUNTER — Encounter: Payer: Self-pay | Admitting: Family Medicine

## 2017-07-04 ENCOUNTER — Ambulatory Visit (INDEPENDENT_AMBULATORY_CARE_PROVIDER_SITE_OTHER): Payer: BLUE CROSS/BLUE SHIELD | Admitting: Family Medicine

## 2017-07-04 VITALS — BP 146/94 | HR 81 | Temp 98.5°F | Wt 180.8 lb

## 2017-07-04 DIAGNOSIS — R591 Generalized enlarged lymph nodes: Secondary | ICD-10-CM | POA: Diagnosis not present

## 2017-07-04 DIAGNOSIS — R21 Rash and other nonspecific skin eruption: Secondary | ICD-10-CM | POA: Diagnosis not present

## 2017-07-04 LAB — CBC WITH DIFFERENTIAL/PLATELET
BASOS PCT: 0 %
Basophils Absolute: 0 cells/uL (ref 0–200)
Eosinophils Absolute: 264 cells/uL (ref 15–500)
Eosinophils Relative: 3 %
HEMATOCRIT: 44.2 % (ref 38.5–50.0)
HEMOGLOBIN: 15 g/dL (ref 13.2–17.1)
LYMPHS ABS: 1936 {cells}/uL (ref 850–3900)
Lymphocytes Relative: 22 %
MCH: 31.1 pg (ref 27.0–33.0)
MCHC: 33.9 g/dL (ref 32.0–36.0)
MCV: 91.5 fL (ref 80.0–100.0)
MPV: 9.8 fL (ref 7.5–12.5)
Monocytes Absolute: 528 cells/uL (ref 200–950)
Monocytes Relative: 6 %
Neutro Abs: 6072 cells/uL (ref 1500–7800)
Neutrophils Relative %: 69 %
Platelets: 216 10*3/uL (ref 140–400)
RBC: 4.83 MIL/uL (ref 4.20–5.80)
RDW: 13.6 % (ref 11.0–15.0)
WBC: 8.8 10*3/uL (ref 3.8–10.8)

## 2017-07-04 NOTE — Patient Instructions (Signed)
Go to the lab on the way out.  We'll contact you with your lab report. Take care.  Glad to see you.  Update me if the spot doesn't get better.

## 2017-07-04 NOTE — Progress Notes (Signed)
He has chronic allergy sx, year-round, with some ST and upper airway/nasal congestion.  He had been in the habit of checking for lymphadenopathy in his neck.  He noted a tender spot on the L side of the neck, noted about 2 days ago.  No FCNAVD.  No ear pain.    He had checked BP out of clinic and was prev normal. He can check that as outpatient, d/w pt.    Unclear if GERD is affecting his ST, has noted throat irritation if he misses a dose of PPI.    Also with rash noted on the left arm. Does not itch.  Meds, vitals, and allergies reviewed.   ROS: Per HPI unless specifically indicated in ROS section   nad ncat Tm wnl Nasal exam w/o erythema MMM no ulceration or lesions notes.  Neck with single LN on the L side, no other LA noted  rrr ctab abd soft, not ttp Likely fungal rash on the L arm.  Macular, with some satellite lesions. No ulceration.

## 2017-07-05 LAB — COMPREHENSIVE METABOLIC PANEL
ALBUMIN: 4.3 g/dL (ref 3.6–5.1)
ALT: 10 U/L (ref 9–46)
AST: 13 U/L (ref 10–35)
Alkaline Phosphatase: 87 U/L (ref 40–115)
BUN: 11 mg/dL (ref 7–25)
CHLORIDE: 99 mmol/L (ref 98–110)
CO2: 25 mmol/L (ref 20–32)
Calcium: 9.4 mg/dL (ref 8.6–10.3)
Creat: 0.94 mg/dL (ref 0.70–1.33)
Glucose, Bld: 87 mg/dL (ref 65–99)
POTASSIUM: 4.1 mmol/L (ref 3.5–5.3)
Sodium: 136 mmol/L (ref 135–146)
TOTAL PROTEIN: 7.1 g/dL (ref 6.1–8.1)
Total Bilirubin: 0.5 mg/dL (ref 0.2–1.2)

## 2017-07-06 DIAGNOSIS — R21 Rash and other nonspecific skin eruption: Secondary | ICD-10-CM | POA: Insufficient documentation

## 2017-07-06 DIAGNOSIS — R591 Generalized enlarged lymph nodes: Secondary | ICD-10-CM | POA: Insufficient documentation

## 2017-07-06 NOTE — Assessment & Plan Note (Signed)
Likely fungal.He can try clotrimazole.  Update me as needed.

## 2017-07-06 NOTE — Assessment & Plan Note (Signed)
Single lesion. No alarming symptoms otherwise. Okay for outpatient follow-up. He will update me if it does not resolve. Check routine labs in the meantime. He agrees.

## 2017-07-28 ENCOUNTER — Encounter: Payer: Self-pay | Admitting: Family Medicine

## 2017-07-31 ENCOUNTER — Telehealth: Payer: Self-pay | Admitting: Family Medicine

## 2017-07-31 NOTE — Telephone Encounter (Signed)
Patient advised. Appointment scheduled.  

## 2017-07-31 NOTE — Telephone Encounter (Signed)
I sent mychart message but please try to call pt.  See if he can get on the schedule to recheck the spots he had noted.  Thanks.

## 2017-08-05 ENCOUNTER — Ambulatory Visit (INDEPENDENT_AMBULATORY_CARE_PROVIDER_SITE_OTHER): Payer: BLUE CROSS/BLUE SHIELD | Admitting: Family Medicine

## 2017-08-05 ENCOUNTER — Encounter: Payer: Self-pay | Admitting: Family Medicine

## 2017-08-05 DIAGNOSIS — R221 Localized swelling, mass and lump, neck: Secondary | ICD-10-CM

## 2017-08-05 DIAGNOSIS — R591 Generalized enlarged lymph nodes: Secondary | ICD-10-CM

## 2017-08-05 NOTE — Patient Instructions (Signed)
I'll check on ordering the CT of neck, I'll see about getting it done with/without contrast.  Take care.  Glad to see you.

## 2017-08-05 NOTE — Progress Notes (Signed)
He recently had a cold sore come up on the lower lip.  He still thinks he feels the node on the L side of the neck.    He though he had mildly enlarged LN x2, d/w pt.  See prev mychart message.    He has had either allergies or a cold intermittently recently.    Meds, vitals, and allergies reviewed.   ROS: Per HPI unless specifically indicated in ROS section   nad ncat TM wnl Nasal exam mildly stuffy OP wnl except for resolving cold sore noted.  Neck supple, single LN on the L side of the neck is palpable- similar to prev, small <1cm by palpation, minimally ttp.  No other LA in the neck or along the clavicles or in the B axilla

## 2017-08-07 NOTE — Assessment & Plan Note (Signed)
Will order CT neck and update patient.  Prev CBC unremarkable.

## 2017-08-12 DIAGNOSIS — R221 Localized swelling, mass and lump, neck: Secondary | ICD-10-CM | POA: Diagnosis not present

## 2017-08-13 ENCOUNTER — Encounter: Payer: Self-pay | Admitting: Family Medicine

## 2017-09-02 ENCOUNTER — Encounter: Payer: Self-pay | Admitting: Family Medicine

## 2017-09-22 ENCOUNTER — Other Ambulatory Visit: Payer: Self-pay | Admitting: Family Medicine

## 2017-09-22 DIAGNOSIS — R918 Other nonspecific abnormal finding of lung field: Secondary | ICD-10-CM

## 2017-09-22 NOTE — Progress Notes (Unsigned)
Dictation #1 [id number]  [id number] Due for f/u CT chest re: nodules.  Ordered.  Thanks.   Left detailed message on voicemail.  Ninetta LightsL. Fuqu[place], CMA  09/22/17

## 2017-10-02 DIAGNOSIS — R918 Other nonspecific abnormal finding of lung field: Secondary | ICD-10-CM | POA: Diagnosis not present

## 2017-10-06 ENCOUNTER — Encounter: Payer: Self-pay | Admitting: Family Medicine

## 2017-10-13 ENCOUNTER — Telehealth: Payer: Self-pay

## 2017-10-13 NOTE — Telephone Encounter (Signed)
Copied from CRM [phone]. Topic: Inquiry >> Oct 10, 2017  2:19 PM Yvonna Alanisobinson, Andra M wrote: Reason for CRM: Patient called requesting the results of his CT SCAN. Patient wants Dr. Para Marchuncan to call him on Monday, 10/13/2017.Gregory Wagner..Gregory Wagner

## 2017-10-13 NOTE — Telephone Encounter (Signed)
Patient advised.

## 2017-10-25 DIAGNOSIS — F410 Panic disorder [episodic paroxysmal anxiety] without agoraphobia: Secondary | ICD-10-CM | POA: Diagnosis not present

## 2017-10-25 DIAGNOSIS — F3342 Major depressive disorder, recurrent, in full remission: Secondary | ICD-10-CM | POA: Diagnosis not present

## 2017-11-19 ENCOUNTER — Other Ambulatory Visit: Payer: Self-pay | Admitting: Family Medicine

## 2017-11-19 ENCOUNTER — Other Ambulatory Visit (INDEPENDENT_AMBULATORY_CARE_PROVIDER_SITE_OTHER): Payer: BLUE CROSS/BLUE SHIELD

## 2017-11-19 DIAGNOSIS — F32A Depression, unspecified: Secondary | ICD-10-CM

## 2017-11-19 DIAGNOSIS — Z8639 Personal history of other endocrine, nutritional and metabolic disease: Secondary | ICD-10-CM

## 2017-11-19 DIAGNOSIS — F329 Major depressive disorder, single episode, unspecified: Secondary | ICD-10-CM | POA: Diagnosis not present

## 2017-11-19 LAB — LIPID PANEL
CHOLESTEROL: 165 mg/dL (ref 0–200)
HDL: 65.3 mg/dL (ref >39.00)
LDL CALC: 75 mg/dL (ref 0–99)
NonHDL: 99.32
TRIGLYCERIDES: 124 mg/dL (ref 0.0–149.0)
Total CHOL/HDL Ratio: 3
VLDL: 24.8 mg/dL (ref 0.0–40.0)

## 2017-11-19 LAB — HEPATIC FUNCTION PANEL
ALT: 15 U/L (ref 0–53)
AST: 16 U/L (ref 0–37)
Albumin: 4.3 g/dL (ref 3.5–5.2)
Alkaline Phosphatase: 67 U/L (ref 39–117)
BILIRUBIN DIRECT: 0.1 mg/dL (ref 0.0–0.3)
Total Bilirubin: 0.5 mg/dL (ref 0.2–1.2)
Total Protein: 6.7 g/dL (ref 6.0–8.3)

## 2017-11-19 LAB — BASIC METABOLIC PANEL
BUN: 11 mg/dL (ref 6–23)
CHLORIDE: 100 meq/L (ref 96–112)
CO2: 30 meq/L (ref 19–32)
Calcium: 9.4 mg/dL (ref 8.4–10.5)
Creatinine, Ser: 0.89 mg/dL (ref 0.40–1.50)
GFR: 94.99 mL/min (ref >60.00)
GLUCOSE: 86 mg/dL (ref 70–99)
POTASSIUM: 4.2 meq/L (ref 3.5–5.1)
SODIUM: 138 meq/L (ref 135–145)

## 2017-11-21 ENCOUNTER — Encounter: Payer: Self-pay | Admitting: Family Medicine

## 2017-11-21 ENCOUNTER — Ambulatory Visit (INDEPENDENT_AMBULATORY_CARE_PROVIDER_SITE_OTHER): Payer: BLUE CROSS/BLUE SHIELD | Admitting: Family Medicine

## 2017-11-21 VITALS — BP 112/84 | HR 74 | Temp 98.3°F | Ht 69.0 in | Wt 177.5 lb

## 2017-11-21 DIAGNOSIS — Z0000 Encounter for general adult medical examination without abnormal findings: Secondary | ICD-10-CM | POA: Diagnosis not present

## 2017-11-21 DIAGNOSIS — F32A Depression, unspecified: Secondary | ICD-10-CM

## 2017-11-21 DIAGNOSIS — B001 Herpesviral vesicular dermatitis: Secondary | ICD-10-CM

## 2017-11-21 DIAGNOSIS — F17200 Nicotine dependence, unspecified, uncomplicated: Secondary | ICD-10-CM

## 2017-11-21 DIAGNOSIS — K2270 Barrett's esophagus without dysplasia: Secondary | ICD-10-CM

## 2017-11-21 DIAGNOSIS — F329 Major depressive disorder, single episode, unspecified: Secondary | ICD-10-CM

## 2017-11-21 DIAGNOSIS — Z7189 Other specified counseling: Secondary | ICD-10-CM

## 2017-11-21 MED ORDER — VALACYCLOVIR HCL 1 G PO TABS: 2000.0000 mg | ORAL_TABLET | Freq: Two times a day (BID) | ORAL | 2 refills | Status: DC

## 2017-11-21 NOTE — Progress Notes (Signed)
CPE- See plan.  Routine anticipatory guidance given to patient.  See health maintenance.  The possibility exists that previously documented standard health maintenance information may have been brought forward from a previous encounter into this note.  If needed, that same information has been updated to reflect the current situation based on today's encounter.    Tetanus 2014  Flu 2018 PNA 2013  Colonoscopy prev done per Dr Elnoria HowardHung.  Prostate cancer screening and PSA options(with potential risks and benefits of testing vs not testing) were discussed along with recent recs/guidelines. He declined testing PSAat this point. Living will d/w pt. Would have sister designated if patient were incapacitated.  Smoking. ~1 PPD. Inc with stressors, he is considering taper in 2019, d/w pt.   Diet and exercise d/w pt. encouraged work on diet and exercise.  HCV screening prev done.   HIV screening d/w pt. Prev neg per patient ~2013.   Prev imaging d/w pt, copy of prev CT reports given to patient and discussed.  No f/u needed.    H/o barrett's but on PPI.  D/w pt.  Compliant.  No ADE on med.    Using prn valtex.    He has a job interview today.  D/w pt.  His home situation is good.   PMH and SH reviewed  Meds, vitals, and allergies reviewed.   ROS: Per HPI.  Unless specifically indicated otherwise in HPI, the patient denies:  General: fever. Eyes: acute vision changes ENT: sore throat Cardiovascular: chest pain Respiratory: SOB GI: vomiting GU: dysuria Musculoskeletal: acute back pain Derm: acute rash Neuro: acute motor dysfunction Psych: worsening mood Endocrine: polydipsia Heme: bleeding Allergy: hayfever  GEN: nad, alert and oriented HEENT: mucous membranes moist NECK: supple w/o LA CV: rrr. PULM: ctab, no inc wob ABD: soft, +bs EXT: no edema SKIN: no acute rash

## 2017-11-21 NOTE — Patient Instructions (Signed)
Use valtrex as needed.  If needed frequently, then update me and we can set up suppressive treatment.  Take care.  Glad to see you.

## 2017-11-23 NOTE — Assessment & Plan Note (Signed)
Tetanus 2014  Flu 2018 PNA 2013  Colonoscopy prev done per Dr Elnoria HowardHung.  Prostate cancer screening and PSA options(with potential risks and benefits of testing vs not testing) were discussed along with recent recs/guidelines. He declined testing PSAat this point. Living will d/w pt. Would have sister designated if patient were incapacitated.  Smoking. ~1 PPD. Inc with stressors, he is considering taper in 2019, d/w pt.   Diet and exercise d/w pt. encouraged work on diet and exercise.  HCV screening prev done.   HIV screening d/w pt. Prev neg per patient ~2013.

## 2017-11-23 NOTE — Assessment & Plan Note (Signed)
Continue ppi

## 2017-11-23 NOTE — Assessment & Plan Note (Signed)
Living will d/w pt. Would have sister designated if patient were incapacitated.  

## 2017-11-23 NOTE — Assessment & Plan Note (Signed)
Using prn valtex.

## 2017-11-23 NOTE — Assessment & Plan Note (Signed)
Encouraged cessation.

## 2017-11-23 NOTE — Assessment & Plan Note (Signed)
Per psych 

## 2017-12-01 ENCOUNTER — Other Ambulatory Visit: Payer: Self-pay | Admitting: *Deleted

## 2017-12-01 NOTE — Telephone Encounter (Signed)
Faxed refill request. Valacyclovir 1 gram Last office visit:   11/21/17 Last Filled:     20 tablet 2 11/21/2017  Patient requests 90 supply:  60 tablets. Please advise.

## 2017-12-02 MED ORDER — VALACYCLOVIR HCL 1 G PO TABS: 2000.0000 mg | ORAL_TABLET | Freq: Two times a day (BID) | ORAL | 3 refills | Status: DC

## 2017-12-04 DIAGNOSIS — H5713 Ocular pain, bilateral: Secondary | ICD-10-CM | POA: Diagnosis not present

## 2018-04-18 DIAGNOSIS — F3342 Major depressive disorder, recurrent, in full remission: Secondary | ICD-10-CM | POA: Diagnosis not present

## 2018-04-18 DIAGNOSIS — F410 Panic disorder [episodic paroxysmal anxiety] without agoraphobia: Secondary | ICD-10-CM | POA: Diagnosis not present

## 2018-10-10 DIAGNOSIS — F411 Generalized anxiety disorder: Secondary | ICD-10-CM | POA: Diagnosis not present

## 2018-10-10 DIAGNOSIS — F3342 Major depressive disorder, recurrent, in full remission: Secondary | ICD-10-CM | POA: Diagnosis not present

## 2018-11-10 ENCOUNTER — Other Ambulatory Visit: Payer: Self-pay | Admitting: Family Medicine

## 2018-11-10 DIAGNOSIS — Z8639 Personal history of other endocrine, nutritional and metabolic disease: Secondary | ICD-10-CM

## 2018-11-17 ENCOUNTER — Telehealth: Payer: Self-pay | Admitting: Family Medicine

## 2018-11-17 NOTE — Telephone Encounter (Signed)
Pt called office requesting to get a lung CT scan before he comes in for his physical. His physical is scheduled for 11/27/18. Please advise pt

## 2018-11-18 NOTE — Telephone Encounter (Signed)
His last CT scan of his lungs in 2018 noted that there was no need for follow-up imaging. We would not normally repeat the chest CT unless he had another indication.  If he is talking about a referral for lung cancer screening, then we can talk about that at the office visit. Please let me know if he has additional information that would change the plan at this point.  Thanks.

## 2018-11-19 ENCOUNTER — Other Ambulatory Visit (INDEPENDENT_AMBULATORY_CARE_PROVIDER_SITE_OTHER): Payer: BLUE CROSS/BLUE SHIELD

## 2018-11-19 DIAGNOSIS — Z8639 Personal history of other endocrine, nutritional and metabolic disease: Secondary | ICD-10-CM

## 2018-11-19 LAB — COMPREHENSIVE METABOLIC PANEL
ALT: 12 U/L (ref 0–53)
AST: 12 U/L (ref 0–37)
Albumin: 4.5 g/dL (ref 3.5–5.2)
Alkaline Phosphatase: 69 U/L (ref 39–117)
BUN: 9 mg/dL (ref 6–23)
CALCIUM: 9.8 mg/dL (ref 8.4–10.5)
CHLORIDE: 100 meq/L (ref 96–112)
CO2: 30 mEq/L (ref 19–32)
Creatinine, Ser: 0.95 mg/dL (ref 0.40–1.50)
GFR: 87.77 mL/min (ref >60.00)
Glucose, Bld: 89 mg/dL (ref 70–99)
POTASSIUM: 4.2 meq/L (ref 3.5–5.1)
Sodium: 137 mEq/L (ref 135–145)
TOTAL PROTEIN: 7.2 g/dL (ref 6.0–8.3)
Total Bilirubin: 0.5 mg/dL (ref 0.2–1.2)

## 2018-11-19 LAB — LIPID PANEL
Cholesterol: 167 mg/dL (ref 0–200)
HDL: 68.5 mg/dL (ref >39.00)
LDL CALC: 73 mg/dL (ref 0–99)
NonHDL: 98.34
Total CHOL/HDL Ratio: 2
Triglycerides: 127 mg/dL (ref 0.0–149.0)
VLDL: 25.4 mg/dL (ref 0.0–40.0)

## 2018-11-19 NOTE — Telephone Encounter (Signed)
Patient advised.

## 2018-11-23 ENCOUNTER — Encounter: Payer: BLUE CROSS/BLUE SHIELD | Admitting: Family Medicine

## 2018-11-27 ENCOUNTER — Ambulatory Visit (INDEPENDENT_AMBULATORY_CARE_PROVIDER_SITE_OTHER): Payer: BLUE CROSS/BLUE SHIELD | Admitting: Family Medicine

## 2018-11-27 ENCOUNTER — Encounter: Payer: Self-pay | Admitting: Family Medicine

## 2018-11-27 VITALS — BP 112/82 | HR 75 | Temp 98.4°F | Ht 70.0 in | Wt 174.8 lb

## 2018-11-27 DIAGNOSIS — F32A Depression, unspecified: Secondary | ICD-10-CM

## 2018-11-27 DIAGNOSIS — K2270 Barrett's esophagus without dysplasia: Secondary | ICD-10-CM

## 2018-11-27 DIAGNOSIS — B001 Herpesviral vesicular dermatitis: Secondary | ICD-10-CM

## 2018-11-27 DIAGNOSIS — F17200 Nicotine dependence, unspecified, uncomplicated: Secondary | ICD-10-CM

## 2018-11-27 DIAGNOSIS — F329 Major depressive disorder, single episode, unspecified: Secondary | ICD-10-CM

## 2018-11-27 DIAGNOSIS — Z7189 Other specified counseling: Secondary | ICD-10-CM

## 2018-11-27 DIAGNOSIS — Z0000 Encounter for general adult medical examination without abnormal findings: Secondary | ICD-10-CM | POA: Diagnosis not present

## 2018-11-27 MED ORDER — VALACYCLOVIR HCL 1 G PO TABS: 2000.0000 mg | ORAL_TABLET | Freq: Two times a day (BID) | ORAL | 3 refills | Status: DC

## 2018-11-27 NOTE — Patient Instructions (Signed)
Keep working on diet and exercise.  Update me as needed.  Thanks for your effort.  Take care.  Glad to see you.

## 2018-11-27 NOTE — Progress Notes (Signed)
CPE- See plan.  Routine anticipatory guidance given to patient.  See health maintenance.  The possibility exists that previously documented standard health maintenance information may have been brought forward from a previous encounter into this note.  If needed, that same information has been updated to reflect the current situation based on today's encounter.    Tetanus 2014  Flu 2019 PNA 2013  Colonoscopy prev done per Dr Elnoria Howard.  Prostate cancer screening and PSA options(with potential risks and benefits of testing vs not testing) were discussed along with recent recs/guidelines. He declined testing PSAat this point. Living will d/w pt. Would have sister designated if patient were incapacitated.  Smoking. ~1 PPD. Inc with stressors, he is considering taper in 2020, d/w pt.   Diet and exercise d/w pt. encouraged work on diet and exercise.  HCV screening prev done.  HIV screening d/w pt. Prev neg per patient ~2013.   Wouldn't yet qualify for lung CA screening, due to age, d/w pt. he did not need follow-up at this point, based on previous imaging.  H/o barrett's but on PPI.  D/w pt.  Compliant.  No ADE on med.    Using prn valtex.  with relief.  No ADE on med.    Seeing Dr. Evelene Croon with psych.  I will defer.  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   PMH and SH reviewed Meds, vitals, and allergies reviewed.   ROS: Per HPI.  Unless specifically indicated otherwise in HPI, the patient denies:  General: fever. Eyes: acute vision changes ENT: sore throat Cardiovascular: chest pain Respiratory: SOB GI: vomiting GU: dysuria Musculoskeletal: acute back pain Derm: acute rash Neuro: acute motor dysfunction Psych: worsening mood Endocrine: polydipsia Heme: bleeding Allergy: hayfever  GEN: nad, alert and  oriented HEENT: mucous membranes moist NECK: supple w/o LA CV: rrr. PULM: ctab, no inc wob ABD: soft, +bs EXT: no edema SKIN: no acute rash

## 2018-11-29 NOTE — Assessment & Plan Note (Signed)
Tetanus 2014  Flu 2019 PNA 2013  Colonoscopy prev done per Dr Elnoria Howard.  Prostate cancer screening and PSA options(with potential risks and benefits of testing vs not testing) were discussed along with recent recs/guidelines. He declined testing PSAat this point. Living will d/w pt. Would have sister designated if patient were incapacitated.  Smoking. ~1 PPD. Inc with stressors, he is considering taper in 2020, d/w pt.   Diet and exercise d/w pt. encouraged work on diet and exercise.  HCV screening prev done.  HIV screening d/w pt. Prev neg per patient ~2013.   Wouldn't yet qualify for lung CA screening, due to age, d/w pt. he did not need follow-up at this point, based on previous imaging.

## 2018-11-29 NOTE — Assessment & Plan Note (Signed)
Using prn valtex.  with relief.  No ADE on med.

## 2018-11-29 NOTE — Assessment & Plan Note (Signed)
H/o barrett's but on PPI.  D/w pt.  Compliant.  No ADE on med.

## 2018-11-29 NOTE — Assessment & Plan Note (Signed)
Wouldn't yet qualify for lung CA screening, due to age, d/w pt. he did not need follow-up at this point, based on previous imaging.  No new symptoms that would direct imaging at this point.  Smoking cessation discussed.

## 2018-11-29 NOTE — Assessment & Plan Note (Signed)
Seeing Dr. Evelene CroonKaur with psych.  I will defer.  Discussed healthy habits.  Compliant with medication.

## 2018-11-29 NOTE — Assessment & Plan Note (Signed)
Living will d/w pt. Would have sister designated if patient were incapacitated.  

## 2019-02-18 ENCOUNTER — Other Ambulatory Visit: Payer: Self-pay | Admitting: Family Medicine

## 2019-03-08 ENCOUNTER — Other Ambulatory Visit: Payer: Self-pay | Admitting: Family Medicine

## 2019-03-15 DIAGNOSIS — F4322 Adjustment disorder with anxiety: Secondary | ICD-10-CM | POA: Diagnosis not present

## 2019-03-15 DIAGNOSIS — F410 Panic disorder [episodic paroxysmal anxiety] without agoraphobia: Secondary | ICD-10-CM | POA: Diagnosis not present

## 2019-04-03 DIAGNOSIS — F4322 Adjustment disorder with anxiety: Secondary | ICD-10-CM | POA: Diagnosis not present

## 2019-04-26 DIAGNOSIS — L821 Other seborrheic keratosis: Secondary | ICD-10-CM | POA: Diagnosis not present

## 2019-04-26 DIAGNOSIS — B360 Pityriasis versicolor: Secondary | ICD-10-CM | POA: Diagnosis not present

## 2019-04-26 DIAGNOSIS — L820 Inflamed seborrheic keratosis: Secondary | ICD-10-CM | POA: Diagnosis not present

## 2019-05-06 DIAGNOSIS — F331 Major depressive disorder, recurrent, moderate: Secondary | ICD-10-CM | POA: Diagnosis not present

## 2019-05-06 DIAGNOSIS — F411 Generalized anxiety disorder: Secondary | ICD-10-CM | POA: Diagnosis not present

## 2019-05-12 DIAGNOSIS — F4322 Adjustment disorder with anxiety: Secondary | ICD-10-CM | POA: Diagnosis not present

## 2019-05-18 DIAGNOSIS — F331 Major depressive disorder, recurrent, moderate: Secondary | ICD-10-CM | POA: Diagnosis not present

## 2019-05-18 DIAGNOSIS — F411 Generalized anxiety disorder: Secondary | ICD-10-CM | POA: Diagnosis not present

## 2019-05-31 DIAGNOSIS — F411 Generalized anxiety disorder: Secondary | ICD-10-CM | POA: Diagnosis not present

## 2019-05-31 DIAGNOSIS — F331 Major depressive disorder, recurrent, moderate: Secondary | ICD-10-CM | POA: Diagnosis not present

## 2019-06-11 DIAGNOSIS — F1010 Alcohol abuse, uncomplicated: Secondary | ICD-10-CM | POA: Diagnosis not present

## 2019-06-11 DIAGNOSIS — F410 Panic disorder [episodic paroxysmal anxiety] without agoraphobia: Secondary | ICD-10-CM | POA: Diagnosis not present

## 2019-06-11 DIAGNOSIS — F331 Major depressive disorder, recurrent, moderate: Secondary | ICD-10-CM | POA: Diagnosis not present

## 2019-06-14 DIAGNOSIS — F411 Generalized anxiety disorder: Secondary | ICD-10-CM | POA: Diagnosis not present

## 2019-06-14 DIAGNOSIS — F331 Major depressive disorder, recurrent, moderate: Secondary | ICD-10-CM | POA: Diagnosis not present

## 2019-06-24 DIAGNOSIS — F331 Major depressive disorder, recurrent, moderate: Secondary | ICD-10-CM | POA: Diagnosis not present

## 2019-06-24 DIAGNOSIS — F411 Generalized anxiety disorder: Secondary | ICD-10-CM | POA: Diagnosis not present

## 2019-07-05 DIAGNOSIS — F331 Major depressive disorder, recurrent, moderate: Secondary | ICD-10-CM | POA: Diagnosis not present

## 2019-07-05 DIAGNOSIS — F411 Generalized anxiety disorder: Secondary | ICD-10-CM | POA: Diagnosis not present

## 2019-07-06 DIAGNOSIS — F1010 Alcohol abuse, uncomplicated: Secondary | ICD-10-CM | POA: Diagnosis not present

## 2019-07-06 DIAGNOSIS — F331 Major depressive disorder, recurrent, moderate: Secondary | ICD-10-CM | POA: Diagnosis not present

## 2019-07-07 DIAGNOSIS — Z130 Encounter for screening for diseases of the blood and blood-forming organs and certain disorders involving the immune mechanism: Secondary | ICD-10-CM | POA: Diagnosis not present

## 2019-07-07 DIAGNOSIS — Z79899 Other long term (current) drug therapy: Secondary | ICD-10-CM | POA: Diagnosis not present

## 2019-07-15 DIAGNOSIS — F411 Generalized anxiety disorder: Secondary | ICD-10-CM | POA: Diagnosis not present

## 2019-07-15 DIAGNOSIS — F331 Major depressive disorder, recurrent, moderate: Secondary | ICD-10-CM | POA: Diagnosis not present

## 2019-07-27 DIAGNOSIS — F411 Generalized anxiety disorder: Secondary | ICD-10-CM | POA: Diagnosis not present

## 2019-07-27 DIAGNOSIS — F331 Major depressive disorder, recurrent, moderate: Secondary | ICD-10-CM | POA: Diagnosis not present

## 2019-08-09 DIAGNOSIS — F331 Major depressive disorder, recurrent, moderate: Secondary | ICD-10-CM | POA: Diagnosis not present

## 2019-08-09 DIAGNOSIS — F411 Generalized anxiety disorder: Secondary | ICD-10-CM | POA: Diagnosis not present

## 2019-08-24 DIAGNOSIS — F411 Generalized anxiety disorder: Secondary | ICD-10-CM | POA: Diagnosis not present

## 2019-08-24 DIAGNOSIS — F331 Major depressive disorder, recurrent, moderate: Secondary | ICD-10-CM | POA: Diagnosis not present

## 2019-09-07 DIAGNOSIS — F10150 Alcohol abuse with alcohol-induced psychotic disorder with delusions: Secondary | ICD-10-CM | POA: Diagnosis not present

## 2019-09-07 DIAGNOSIS — F411 Generalized anxiety disorder: Secondary | ICD-10-CM | POA: Diagnosis not present

## 2019-09-07 DIAGNOSIS — F331 Major depressive disorder, recurrent, moderate: Secondary | ICD-10-CM | POA: Diagnosis not present

## 2019-09-08 DIAGNOSIS — F3341 Major depressive disorder, recurrent, in partial remission: Secondary | ICD-10-CM | POA: Diagnosis not present

## 2019-09-08 DIAGNOSIS — F410 Panic disorder [episodic paroxysmal anxiety] without agoraphobia: Secondary | ICD-10-CM | POA: Diagnosis not present

## 2019-11-08 ENCOUNTER — Other Ambulatory Visit: Payer: Self-pay | Admitting: Family Medicine

## 2019-11-08 NOTE — Telephone Encounter (Signed)
Electronic refill request. Valacyclovir Last office visit:   11/27/2018 Last Filled:    60 tablet 0 02/18/2019   Please advise.

## 2019-11-09 NOTE — Telephone Encounter (Signed)
Sent. Thanks.   

## 2019-11-21 ENCOUNTER — Other Ambulatory Visit: Payer: Self-pay | Admitting: Family Medicine

## 2019-11-21 DIAGNOSIS — Z8639 Personal history of other endocrine, nutritional and metabolic disease: Secondary | ICD-10-CM

## 2019-11-24 ENCOUNTER — Telehealth: Payer: Self-pay

## 2019-11-24 NOTE — Telephone Encounter (Signed)
LVM w COVID screen, front door and back lab info 1.6.2021 TLJ  

## 2019-11-26 ENCOUNTER — Other Ambulatory Visit (INDEPENDENT_AMBULATORY_CARE_PROVIDER_SITE_OTHER): Payer: BC Managed Care – PPO

## 2019-11-26 ENCOUNTER — Other Ambulatory Visit: Payer: BLUE CROSS/BLUE SHIELD

## 2019-11-26 ENCOUNTER — Other Ambulatory Visit: Payer: Self-pay

## 2019-11-26 DIAGNOSIS — Z8639 Personal history of other endocrine, nutritional and metabolic disease: Secondary | ICD-10-CM

## 2019-11-26 LAB — COMPREHENSIVE METABOLIC PANEL
ALT: 57 U/L — ABNORMAL HIGH (ref 0–53)
AST: 28 U/L (ref 0–37)
Albumin: 4.5 g/dL (ref 3.5–5.2)
Alkaline Phosphatase: 81 U/L (ref 39–117)
BUN: 9 mg/dL (ref 6–23)
CO2: 31 mEq/L (ref 19–32)
Calcium: 9.8 mg/dL (ref 8.4–10.5)
Chloride: 98 mEq/L (ref 96–112)
Creatinine, Ser: 0.86 mg/dL (ref 0.40–1.50)
GFR: 92.28 mL/min (ref >60.00)
Glucose, Bld: 80 mg/dL (ref 70–99)
Potassium: 4.5 mEq/L (ref 3.5–5.1)
Sodium: 135 mEq/L (ref 135–145)
Total Bilirubin: 0.5 mg/dL (ref 0.2–1.2)
Total Protein: 6.9 g/dL (ref 6.0–8.3)

## 2019-11-26 LAB — LIPID PANEL
Cholesterol: 205 mg/dL — ABNORMAL HIGH (ref 0–200)
HDL: 63.7 mg/dL
LDL Cholesterol: 125 mg/dL — ABNORMAL HIGH (ref 0–99)
NonHDL: 141.05
Total CHOL/HDL Ratio: 3
Triglycerides: 81 mg/dL (ref 0.0–149.0)
VLDL: 16.2 mg/dL (ref 0.0–40.0)

## 2019-12-02 ENCOUNTER — Other Ambulatory Visit: Payer: Self-pay | Admitting: Family Medicine

## 2019-12-02 NOTE — Telephone Encounter (Signed)
Last office visit 11/27/2018 for CPE.  Last refilled 11/09/2019 for #60 with no refills.  CPE scheduled for 12/03/2019.

## 2019-12-03 ENCOUNTER — Other Ambulatory Visit: Payer: Self-pay

## 2019-12-03 ENCOUNTER — Encounter: Payer: Self-pay | Admitting: Family Medicine

## 2019-12-03 ENCOUNTER — Ambulatory Visit (INDEPENDENT_AMBULATORY_CARE_PROVIDER_SITE_OTHER): Payer: BC Managed Care – PPO | Admitting: Family Medicine

## 2019-12-03 VITALS — BP 122/84 | HR 97 | Temp 97.6°F | Ht 69.0 in | Wt 186.0 lb

## 2019-12-03 DIAGNOSIS — F17200 Nicotine dependence, unspecified, uncomplicated: Secondary | ICD-10-CM

## 2019-12-03 DIAGNOSIS — Z23 Encounter for immunization: Secondary | ICD-10-CM

## 2019-12-03 DIAGNOSIS — F329 Major depressive disorder, single episode, unspecified: Secondary | ICD-10-CM

## 2019-12-03 DIAGNOSIS — Z7189 Other specified counseling: Secondary | ICD-10-CM

## 2019-12-03 DIAGNOSIS — R238 Other skin changes: Secondary | ICD-10-CM

## 2019-12-03 DIAGNOSIS — Z0000 Encounter for general adult medical examination without abnormal findings: Secondary | ICD-10-CM | POA: Diagnosis not present

## 2019-12-03 DIAGNOSIS — F32A Depression, unspecified: Secondary | ICD-10-CM

## 2019-12-03 DIAGNOSIS — Z122 Encounter for screening for malignant neoplasm of respiratory organs: Secondary | ICD-10-CM

## 2019-12-03 DIAGNOSIS — R7989 Other specified abnormal findings of blood chemistry: Secondary | ICD-10-CM

## 2019-12-03 MED ORDER — TRETINOIN 0.1 % EX CREA: TOPICAL_CREAM | Freq: Every day | CUTANEOUS | 3 refills | Status: DC

## 2019-12-03 NOTE — Telephone Encounter (Signed)
Sent. Thanks.   

## 2019-12-03 NOTE — Patient Instructions (Addendum)
Check with your insurance to see if they will cover the shingles shot. Flu shot today.  [email or web address]  We'll call about the referral for lung cancer screening.  Recheck LFTs in about 2 months.  Please get a nonfasting lab visit.  Take care.  Glad to see you.

## 2019-12-03 NOTE — Progress Notes (Signed)
This visit occurred during the SARS-CoV-2 public health emergency.  Safety protocols were in place, including screening questions prior to the visit, additional usage of staff PPE, and extensive cleaning of exam room while observing appropriate contact time as indicated for disinfecting solutions.  CPE- See plan.  Routine anticipatory guidance given to patient.  See health maintenance.  The possibility exists that previously documented standard health maintenance information may have been brought forward from a previous encounter into this note.  If needed, that same information has been updated to reflect the current situation based on today's encounter.    Tetanus 2014 Flu 2020 PNA 2013  MMR booster done 04/2018 for work Colonoscopy prev done per Dr Benson Norway.  Prostate cancer screening and PSA options(with potential risks and benefits of testing vs not testing) were discussed along with recent recs/guidelines. He declined testing PSAat this point. Living will d/w pt. Would have sister designated if patient were incapacitated.  Smoking. ~1 PPD. Inc with stressors, he is considering taper in 2021, d/w pt. Diet and exercise d/w pt. encouraged work on diet and exercise. HCV screening prev done.  HIV screening d/w pt. Prev neg per patient ~2013.   Lung CA screening d/w pt.  He is going to work on smoking cessation with [employer].   He is 53 with 30 pack year hx. refer for screening.  He is still seeing Dr. Toy Care with psychiatry.  I'll defer to psych about his meds.     We talked about LFT elevation. He had quit drinking recently (stressors from 2020 d/w pt).  He has gone to counseling prev.  D/w pt.  Lipids elevated, d/w pt.  No abd pain, no RUQ pain, no jaundice.  No vomiting.    He was asking about restarting retin A for skin irritation.  He has used in the past.  Recurrent papular changes in the last year, exacerbated by wearing a mask.   PMH and SH reviewed  Meds, vitals, and  allergies reviewed.   ROS: Per HPI.  Unless specifically indicated otherwise in HPI, the patient denies:  General: fever. Eyes: acute vision changes ENT: sore throat Cardiovascular: chest pain Respiratory: SOB GI: vomiting GU: dysuria Musculoskeletal: acute back pain Derm: acute rash Neuro: acute motor dysfunction Psych: worsening mood Endocrine: polydipsia Heme: bleeding Allergy: hayfever  GEN: nad, alert and oriented HEENT: mucous membranes moist NECK: supple w/o LA CV: rrr. PULM: ctab, no inc wob ABD: soft, +bs EXT: no edema SKIN: He has some inflammatory papules noted on the forehead.

## 2019-12-05 DIAGNOSIS — R7989 Other specified abnormal findings of blood chemistry: Secondary | ICD-10-CM | POA: Insufficient documentation

## 2019-12-05 DIAGNOSIS — R238 Other skin changes: Secondary | ICD-10-CM | POA: Insufficient documentation

## 2019-12-05 NOTE — Assessment & Plan Note (Addendum)
We talked about LFT elevation. He had quit drinking recently (stressors from 2020 d/w pt).  He has gone to counseling prev.  D/w pt.  Lipids elevated, d/w pt.  No abd pain, no RUQ pain, no jaundice.  No vomiting.  With his benign exam today, we opted to not change his medications and recheck his labs in 2 months.  See after visit summary.  He will abstain from alcohol in the meantime.

## 2019-12-05 NOTE — Assessment & Plan Note (Signed)
  Lung CA screening d/w pt.  He is going to work on smoking cessation with [employer].   He is 53 with 30 pack year hx. refer for screening.

## 2019-12-05 NOTE — Assessment & Plan Note (Addendum)
He is still seeing Dr. Evelene Croon with psychiatry.  I'll defer to psych about his meds.  Still okay for outpatient follow-up.  He tapered off alcohol.  Still okay for outpatient follow-up.  We will send a copy of his labs to psychiatry.

## 2019-12-05 NOTE — Assessment & Plan Note (Signed)
Tetanus 2014 Flu 2020 PNA 2013  MMR booster done 04/2018 for work Colonoscopy prev done per Dr Benson Norway.  Prostate cancer screening and PSA options(with potential risks and benefits of testing vs not testing) were discussed along with recent recs/guidelines. He declined testing PSAat this point. Living will d/w pt. Would have sister designated if patient were incapacitated.  Smoking. ~1 PPD. Inc with stressors, he is considering taper in 2021, d/w pt. Diet and exercise d/w pt. encouraged work on diet and exercise. HCV screening prev done.  HIV screening d/w pt. Prev neg per patient ~2013.   Lung CA screening d/w pt.  He is going to work on smoking cessation with [employer].   He is 53 with 30 pack year hx. refer for screening.

## 2019-12-05 NOTE — Assessment & Plan Note (Signed)
He can use Retin-A as needed.

## 2019-12-05 NOTE — Assessment & Plan Note (Signed)
Living will d/w pt. Would have sister designated if patient were incapacitated.

## 2019-12-06 ENCOUNTER — Telehealth: Payer: Self-pay | Admitting: Family Medicine

## 2019-12-06 NOTE — Telephone Encounter (Signed)
Gregory Nam, MD  Annamarie Major, CMA  Please send a copy of his labs and his office visit note to Dr. Evelene Croon with psychiatry. Thanks.    Verified with pt provider information to assure I was faxing to correct office. Labs and office notes was faxed to Dr. Evelene Croon at [phone]

## 2019-12-14 ENCOUNTER — Other Ambulatory Visit: Payer: Self-pay | Admitting: Family Medicine

## 2019-12-14 NOTE — Telephone Encounter (Signed)
Electronic refill request. Valacyclovir Last office visit:   12/03/2019 Last Filled:    60 tablet 3 12/03/2019  Please advise.

## 2019-12-15 NOTE — Telephone Encounter (Signed)
Please see if this was an auto refill request.  I thought this was taken care of earlier this month.  Thanks.

## 2019-12-21 ENCOUNTER — Encounter: Payer: Self-pay | Admitting: Family Medicine

## 2019-12-27 ENCOUNTER — Other Ambulatory Visit: Payer: Self-pay | Admitting: *Deleted

## 2019-12-27 DIAGNOSIS — F17210 Nicotine dependence, cigarettes, uncomplicated: Secondary | ICD-10-CM

## 2020-01-05 ENCOUNTER — Ambulatory Visit
Admission: RE | Admit: 2020-01-05 | Discharge: 2020-01-05 | Disposition: A | Discharge status: Home / Self Care | Payer: No Typology Code available for payment source | Source: Ambulatory Visit | Attending: Acute Care | Admitting: Acute Care

## 2020-01-05 ENCOUNTER — Encounter: Payer: Self-pay | Admitting: Acute Care

## 2020-01-05 ENCOUNTER — Other Ambulatory Visit: Payer: Self-pay

## 2020-01-05 ENCOUNTER — Ambulatory Visit (INDEPENDENT_AMBULATORY_CARE_PROVIDER_SITE_OTHER): Payer: BC Managed Care – PPO | Admitting: Acute Care

## 2020-01-05 VITALS — BP 130/90 | HR 88 | Temp 97.2°F | Ht 69.0 in | Wt 185.0 lb

## 2020-01-05 DIAGNOSIS — F17210 Nicotine dependence, cigarettes, uncomplicated: Secondary | ICD-10-CM | POA: Diagnosis not present

## 2020-01-05 NOTE — Progress Notes (Signed)
Shared Decision Making Visit Lung Cancer Screening Program [phone])   Eligibility:  Age 54 y.o.  Pack Years Smoking History Calculation 34 pack year smoking history (# packs/per year x # years smoked)  Recent History of coughing up blood  no  Unexplained weight loss? no ( >Than 15 pounds within the last 6 months )  Prior History Lung / other cancer no (Diagnosis within the last 5 years already requiring surveillance chest CT Scans).  Smoking Status Current Smoker  Former Smokers: Years since quit: NA  Quit Date: NA  Visit Components:  Discussion included one or more decision making aids. yes  Discussion included risk/benefits of screening. yes  Discussion included potential follow up diagnostic testing for abnormal scans. yes  Discussion included meaning and risk of over diagnosis. yes  Discussion included meaning and risk of False Positives. yes  Discussion included meaning of total radiation exposure. yes  Counseling Included:  Importance of adherence to annual lung cancer LDCT screening. yes  Impact of comorbidities on ability to participate in the program. yes  Ability and willingness to under diagnostic treatment. yes  Smoking Cessation Counseling:  Current Smokers:   Discussed importance of smoking cessation. yes  Information about tobacco cessation classes and interventions provided to patient. yes  Patient provided with "ticket" for LDCT Scan. yes  Symptomatic Patient. no  Counseling  Diagnosis Code: Tobacco Use Z72.0  Asymptomatic Patient yes  Counseling (Intermediate counseling: > three minutes counseling) P3295  Former Smokers:   Discussed the importance of maintaining cigarette abstinence. yes  Diagnosis Code: Personal History of Nicotine Dependence. J88.416  Information about tobacco cessation classes and interventions provided to patient. Yes  Patient provided with "ticket" for LDCT Scan. yes  Written Order for Lung Cancer  Screening with LDCT placed in Epic. Yes (CT Chest Lung Cancer Screening Low Dose W/O CM) [id number] Z12.2-Screening of respiratory organs Z87.891-Personal history of nicotine dependence  BP 130/90   Pulse 88   Temp (!) 97.2 F (36.2 C) (Temporal)   Ht 5\' 9"  (1.753 m)   Wt 185 lb (83.9 kg)   SpO2 98%   BMI 27.32 kg/m    I have spent 25 minutes of face to face time with Mr. Radloff discussing the risks and benefits of lung cancer screening. We viewed a power point together that explained in detail the above noted topics. We paused at intervals to allow for questions to be asked and answered to ensure understanding.We discussed that the single most powerful action that he can take to decrease his risk of developing lung cancer is to quit smoking. We discussed whether or not he is ready to commit to setting a quit date. We discussed options for tools to aid in quitting smoking including nicotine replacement therapy, non-nicotine medications, support groups, Quit Smart classes, and behavior modification. We discussed that often times setting smaller, more achievable goals, such as eliminating 1 cigarette a day for a week and then 2 cigarettes a day for a week can be helpful in slowly decreasing the number of cigarettes smoked. This allows for a sense of accomplishment as well as providing a clinical benefit. I gave him the " Be Stronger Than Your Excuses" card with contact information for community resources, classes, free nicotine replacement therapy, and access to mobile apps, text messaging, and on-line smoking cessation help. I have also given him my card and contact information in the event he needs to contact me. We discussed the time and location of the scan, and that  either Doroteo Glassman RN or I will call with the results within 24-48 hours of receiving them. I have offered him  a copy of the power point we viewed  as a resource in the event they need reinforcement of the concepts we discussed today  in the office. The patient verbalized understanding of all of  the above and had no further questions upon leaving the office. They have my contact information in the event they have any further questions.  I spent 3 minutes counseling on smoking cessation and the health risks of continued tobacco abuse.  I explained to the patient that there has been a high incidence of coronary artery disease noted on these exams. I explained that this is a non-gated exam therefore degree or severity cannot be determined. This patient is not on statin therapy. I have asked the patient to follow-up with their PCP regarding any incidental finding of coronary artery disease and management with diet or medication as their PCP  feels is clinically indicated. The patient verbalized understanding of the above and had no further questions upon completion of the visit.  Magdalen Spatz, NP 01/05/2020 1:02 PM

## 2020-01-05 NOTE — Patient Instructions (Signed)

## 2020-01-05 NOTE — Progress Notes (Signed)
Please call patient and let them  know their  low dose Ct was read as a Lung RADS 2: nodules that are benign in appearance and behavior with a very low likelihood of becoming a clinically active cancer due to size or lack of growth. Recommendation per radiology is for a repeat LDCT in 12 months. Please let them  know we will order and schedule their  annual screening scan for 12/2020 Please let them  know there was notation of CAD on their  scan.  Please remind the patient  that this is a non-gated exam therefore degree or severity of disease  cannot be determined. Please have them  follow up with their PCP regarding potential risk factor modification, dietary therapy or pharmacologic therapy if clinically indicated. Pt.  is not  currently on statin therapy. Please place order for annual  screening scan for  12/2020 and fax results to PCP. Thanks so much.  Please let patient know per the CT he appears to have age advanced CAD. Please have him follow up with his PCP. Thanks so much

## 2020-01-09 ENCOUNTER — Encounter: Payer: Self-pay | Admitting: Family Medicine

## 2020-01-09 ENCOUNTER — Telehealth: Payer: Self-pay | Admitting: Family Medicine

## 2020-01-09 DIAGNOSIS — I2510 Atherosclerotic heart disease of native coronary artery without angina pectoris: Secondary | ICD-10-CM | POA: Insufficient documentation

## 2020-01-09 NOTE — Telephone Encounter (Signed)
Please call patient.  Incidental coronary artery disease noted on chest CT.  I think it makes sense to see cardiology.  I can put in a referral for that if he is willing to go.  Please let me know about that.  Likely reasonable to start a statin even if he can get his cholesterol a lot lower with diet and exercise.  The question was about when to recheck his liver test.  Its been about 1 month since he had previous testing done.  If he wants to come in and get his liver test check to make sure those are normal that is fine with me.  Please schedule that in the near future if he agrees.  If his LFTs are normal we can talk about starting a statin.  Thanks.

## 2020-01-10 ENCOUNTER — Other Ambulatory Visit: Payer: Self-pay | Admitting: *Deleted

## 2020-01-10 DIAGNOSIS — F17210 Nicotine dependence, cigarettes, uncomplicated: Secondary | ICD-10-CM

## 2020-01-10 NOTE — Telephone Encounter (Signed)
Patient advised.  Lab appt scheduled.   Patient is willing to see Cardiology, preferably in [place].

## 2020-01-12 ENCOUNTER — Other Ambulatory Visit: Payer: Self-pay | Admitting: Family Medicine

## 2020-01-12 NOTE — Addendum Note (Signed)
Addended by: Joaquim Nam on: 01/12/2020 11:29 AM   Modules accepted: Orders

## 2020-01-12 NOTE — Telephone Encounter (Signed)
Referral placed  Thanks!

## 2020-01-18 ENCOUNTER — Other Ambulatory Visit: Payer: Self-pay

## 2020-01-18 ENCOUNTER — Other Ambulatory Visit (INDEPENDENT_AMBULATORY_CARE_PROVIDER_SITE_OTHER): Payer: BC Managed Care – PPO

## 2020-01-18 DIAGNOSIS — R7989 Other specified abnormal findings of blood chemistry: Secondary | ICD-10-CM | POA: Diagnosis not present

## 2020-01-18 LAB — HEPATIC FUNCTION PANEL
ALT: 32 U/L (ref 0–53)
AST: 17 U/L (ref 0–37)
Albumin: 4.4 g/dL (ref 3.5–5.2)
Alkaline Phosphatase: 70 U/L (ref 39–117)
Bilirubin, Direct: 0.1 mg/dL (ref 0.0–0.3)
Total Bilirubin: 0.5 mg/dL (ref 0.2–1.2)
Total Protein: 7.3 g/dL (ref 6.0–8.3)

## 2020-01-21 ENCOUNTER — Other Ambulatory Visit: Payer: Self-pay

## 2020-01-21 ENCOUNTER — Ambulatory Visit (INDEPENDENT_AMBULATORY_CARE_PROVIDER_SITE_OTHER): Payer: BC Managed Care – PPO | Admitting: Cardiology

## 2020-01-21 ENCOUNTER — Encounter: Payer: Self-pay | Admitting: Cardiology

## 2020-01-21 VITALS — BP 138/92 | HR 83 | Ht 69.0 in | Wt 183.2 lb

## 2020-01-21 DIAGNOSIS — R072 Precordial pain: Secondary | ICD-10-CM | POA: Diagnosis not present

## 2020-01-21 DIAGNOSIS — E7800 Pure hypercholesterolemia, unspecified: Secondary | ICD-10-CM

## 2020-01-21 DIAGNOSIS — Z720 Tobacco use: Secondary | ICD-10-CM

## 2020-01-21 DIAGNOSIS — I2510 Atherosclerotic heart disease of native coronary artery without angina pectoris: Secondary | ICD-10-CM

## 2020-01-21 MED ORDER — METOPROLOL TARTRATE 100 MG PO TABS: 100.0000 mg | ORAL_TABLET | Freq: Once | ORAL | 0 refills | Status: DC

## 2020-01-21 NOTE — Patient Instructions (Signed)
Medication Instructions:  Your physician recommends that you continue on your current medications as directed. Please refer to the Current Medication list given to you today.  *If you need a refill on your cardiac medications before your next appointment, please call your pharmacy*   Testing/Procedures: Your physician has requested that you have cardiac CT. Cardiac computed tomography (CT) is a painless test that uses an x-ray machine to take clear, detailed pictures of your heart. For further information please visit [email or web address] Please follow instruction sheet as given.  Follow-Up: At [facility], you and your health needs are our priority.  As part of our continuing mission to provide you with exceptional heart care, we have created designated Provider Care Teams.  These Care Teams include your primary Cardiologist (physician) and Advanced Practice Providers (APPs -  Physician Assistants and Nurse Practitioners) who all work together to provide you with the care you need, when you need it.  We recommend signing up for the patient portal called "MyChart".  Sign up information is provided on this After Visit Summary.  MyChart is used to connect with patients for Virtual Visits (Telemedicine).  Patients are able to view lab/test results, encounter notes, upcoming appointments, etc.  Non-urgent messages can be sent to your provider as well.   To learn more about what you can do with MyChart, go to [email or web address].    Follow up with Dr. Mayford Knife as needed based on results of testing.    Other Instructions  Your cardiac CT will be scheduled at:   [facility] [address] [place], [place] [address] [phone]  Please arrive at the [facility] main entrance of [facility] 30 minutes prior to test start time. Proceed to the [facility] Radiology Department (first floor) to check-in and test prep.   Please follow these instructions carefully (unless  otherwise directed):  Hold all erectile dysfunction medications at least 3 days (72 hrs) prior to test.  On the Night Before the Test: . Be sure to Drink plenty of water. . Do not consume any caffeinated/decaffeinated beverages or chocolate 12 hours prior to your test. . Do not take any antihistamines 12 hours prior to your test. . If you take Metformin do not take 24 hours prior to test.  On the Day of the Test: . Drink plenty of water. Do not drink any water within one hour of the test. . Do not eat any food 4 hours prior to the test. . You may take your regular medications prior to the test.  . Take metoprolol (Lopressor) two hours prior to test. . HOLD Furosemide/Hydrochlorothiazide morning of the test.       After the Test: . Drink plenty of water. . After receiving IV contrast, you may experience a mild flushed feeling. This is normal. . On occasion, you may experience a mild rash up to 24 hours after the test. This is not dangerous. If this occurs, you can take Benadryl 25 mg and increase your fluid intake. . If you experience trouble breathing, this can be serious. If it is severe call 911 IMMEDIATELY. If it is mild, please call our office. . If you take any of these medications: Glipizide/Metformin, Avandament, Glucavance, please do not take 48 hours after completing test unless otherwise instructed.   Once we have confirmed authorization from your insurance company, we will call you to set up a date and time for your test.   For non-scheduling related questions, please contact the cardiac imaging  nurse navigator should you have any questions/concerns: Marchia Bond, RN Navigator Cardiac Imaging [facility] Heart and Vascular Services [phone] office

## 2020-01-21 NOTE — Progress Notes (Signed)
Cardiology Office Consult  Note    Date:  01/21/2020   ID:  [name], DOB 09/02/1966, MRN [id number]  PCP:  Quintella Reichert, MD  Cardiologist:  Armanda Magic, MD   Chief Complaint  Patient presents with  . New Patient (Initial Visit)    Chest pain and palpitations    History of Present Illness:  Gregory Wagner is a 54 y.o. male who is being seen today for the evaluation of CAD at the request of Gregory Nam, MD.  This is a 54yo male with a history of Barretts esophagus with GERD, depression and coronary calcifications noted on Chest CT 12/2019 that was done for lung CA screening.  He is here today for further evaluation of newly dx coronary calcifications and chest pain.  He tells me that he has a lot of stress in his life with a new job he just started.  He has issues with anxiety as well.  He has a hx of tobacco abuse and smokes more when he is stressed.  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  He has been very stressed about the how our country's politics are and wants to move out of the country.    He says that when he gets stressed he will have chest discomfort across his chest but there is no radiation of the discomfort into his arms or neck and no associated sx of nausea or diaphoresis.  This is episodic and he feels is related to stress  With COVID 19 he has been fairly sedentary and put on weight.  He also notices that occasionally his heart will skip a beat or he will have a few extra heart beats at a time.   He denies any SOB, DOE, PND, orthopnea, LE edema, dizziness,  or syncope. He is compliant with his meds and is tolerating meds with no SE.  He is concerned about his family hx.  His father died in his 38's from a drug overdose but during the autopsy he was found to have a 90% LAD stenosis.  His Mother died of a CVA at 35.  He has tried to quit smoking in the past and the only thing that worked was the nicotine patch. He tried Chantix  in the past but had severe suicidal ideations on it.  He is currently on Wellbutrin for depression but that has not helped any with his tobacco abuse.    Past Medical History:  Diagnosis Date  . Barrett's esophagus   . CAD (coronary artery disease)    LAD calcification noted on chest CT 2/21.  . Depression   . Elevated WBC count    h/o with neg w/u prev, resolved  . GERD (gastroesophageal reflux disease)     Past Surgical History:  Procedure Laterality Date  . chin implant    . PILONIDAL CYST EXCISION      Current Medications: Current Meds  Medication Sig  . ALPRAZolam (XANAX) 1 MG tablet Take 0.5 mg by mouth at bedtime.   Marland Kitchen aspirin 325 MG tablet Take 325 mg by mouth daily.  Marland Kitchen buPROPion (WELLBUTRIN XL) 300 MG 24 hr tablet Take 300 mg by mouth daily.  . Multiple Vitamin (MULTIVITAMIN) tablet Take 1 tablet by mouth daily.  . nefazodone (SERZONE) 200 MG tablet Take 200 mg by mouth daily.   Marland Kitchen omeprazole (PRILOSEC) 20 MG capsule Take 20 mg by mouth daily.  . traZODone (  DESYREL) 150 MG tablet Take one half tablet by mouth daily  . tretinoin (RETIN-A) 0.1 % cream Apply topically at bedtime.  . valACYclovir (VALTREX) 1000 MG tablet TAKE 2 TABLETS (2,000 MG TOTAL) BY MOUTH 2 (TWO) TIMES DAILY. FOR ONE DAY    Allergies:   Doxycycline, Sulfa antibiotics, and Varenicline   Social History   Socioeconomic History  . Marital status: Single    Spouse name: Not on file  . Number of children: Not on file  . Years of education: Not on file  . Highest education level: Not on file  Occupational History  . Not on file  Tobacco Use  . Smoking status: Current Every Day Smoker    Packs/day: 1.00    Years: 34.00    Pack years: 34.00  . Smokeless tobacco: Never Used  Substance and Sexual Activity  . Alcohol use: Not Currently  . Drug use: No  . Sexual activity: Not on file  Other Topics Concern  . Not on file  Social History Narrative   Travel RN   Married 2015   Social Determinants  of Health   Financial Resource Strain:   . Difficulty of Paying Living Expenses: Not on file  Food Insecurity:   . Worried About Programme researcher, broadcasting/film/video in the Last Year: Not on file  . Ran Out of Food in the Last Year: Not on file  Transportation Needs:   . Lack of Transportation (Medical): Not on file  . Lack of Transportation (Non-Medical): Not on file  Physical Activity:   . Days of Exercise per Week: Not on file  . Minutes of Exercise per Session: Not on file  Stress:   . Feeling of Stress : Not on file  Social Connections:   . Frequency of Communication with Friends and Family: Not on file  . Frequency of Social Gatherings with Friends and Family: Not on file  . Attends Religious Services: Not on file  . Active Member of Clubs or Organizations: Not on file  . Attends Banker Meetings: Not on file  . Marital Status: Not on file     Family History:  The patient's family history includes Alcohol abuse in his father; COPD in his mother; Depression in his mother; Heart disease in his father; Stroke in his mother.   ROS:   Please see the history of present illness.    ROS All other systems reviewed and are negative.  No flowsheet data found.     PHYSICAL EXAM:   VS:  BP (!) 138/92   Pulse 83   Ht 5\' 9"  (1.753 m)   Wt 183 lb 3.2 oz (83.1 kg)   BMI 27.05 kg/m    GEN: Well nourished, well developed, in no acute distress  HEENT: normal  Neck: no JVD, carotid bruits, or masses Cardiac: RRR; no murmurs, rubs, or gallops,no edema.  Intact distal pulses bilaterally.  Respiratory:  clear to auscultation bilaterally, normal work of breathing GI: soft, nontender, nondistended, + BS MS: no deformity or atrophy  Skin: warm and dry, no rash Neuro:  Alert and Oriented x 3, Strength and sensation are intact Psych: euthymic mood, full affect  Wt Readings from Last 3 Encounters:  01/21/20 183 lb 3.2 oz (83.1 kg)  01/05/20 185 lb (83.9 kg)  12/03/19 186 lb (84.4 kg)        Studies/Labs Reviewed:   EKG:  EKG is ordered today.  The ekg ordered today demonstrates NSR with no ST changes  Recent Labs: 11/26/2019: BUN 9; Creatinine, Ser 0.86; Potassium 4.5; Sodium 135 01/18/2020: ALT 32   Lipid Panel    Component Value Date/Time   CHOL 205 (H) 11/26/2019 1007   TRIG 81.0 11/26/2019 1007   HDL 63.70 11/26/2019 1007   CHOLHDL 3 11/26/2019 1007   VLDL 16.2 11/26/2019 1007   LDLCALC 125 (H) 11/26/2019 1007    Additional studies/ records that were reviewed today include:  Office notes from PCP and CHest CT    ASSESSMENT:    1. Coronary artery calcification seen on CAT scan   2. Tobacco abuse   3. Pure hypercholesterolemia   4. Precordial pain      PLAN:  In order of problems listed above:  1. Coronary artery calcifications on CT scan -noted in the LAD -he is an ongoing smoker -he has a fm hx of CAD in his father who died of an overdose but on autopsy had a 90% LAD stenosis -since he has been having some atypical CP and known coronary calcium in LAD on CT, I have recommended a coronary CTA to further define coronary anatomy -I encouraged him to try to quit smoking  2.  Tobacco abuse counseling -discussed cessation for 5 minutes -encouraged him to cut back and quit smoking -tobacco cessation drugs discussed but he did not tolerate Chantix in the past and he is on Wellbutrin for depression but dose not help with smoking -he is going to try nicotine patch again  3.  HLD -LDL goal is < 100 but if calcium score elevated will be < 70 -his HDL is 63 -await coronary calcium score and likely will need addition of statin    Medication Adjustments/Labs and Tests Ordered: Current medicines are reviewed at length with the patient today.  Concerns regarding medicines are outlined above.  Medication changes, Labs and Tests ordered today are listed in the Patient Instructions below.  Patient Instructions  Medication Instructions:  Your physician  recommends that you continue on your current medications as directed. Please refer to the Current Medication list given to you today.  *If you need a refill on your cardiac medications before your next appointment, please call your pharmacy*   Testing/Procedures: Your physician has requested that you have cardiac CT. Cardiac computed tomography (CT) is a painless test that uses an x-ray machine to take clear, detailed pictures of your heart. For further information please visit [email or web address] Please follow instruction sheet as given.  Follow-Up: At [facility], you and your health needs are our priority.  As part of our continuing mission to provide you with exceptional heart care, we have created designated Provider Care Teams.  These Care Teams include your primary Cardiologist (physician) and Advanced Practice Providers (APPs -  Physician Assistants and Nurse Practitioners) who all work together to provide you with the care you need, when you need it.  We recommend signing up for the patient portal called "MyChart".  Sign up information is provided on this After Visit Summary.  MyChart is used to connect with patients for Virtual Visits (Telemedicine).  Patients are able to view lab/test results, encounter notes, upcoming appointments, etc.  Non-urgent messages can be sent to your provider as well.   To learn more about what you can do with MyChart, go to [email or web address].    Follow up with Dr. Mayford Knife as needed based on results of testing.    Other Instructions  Your cardiac CT will be scheduled at:   [facility] [address]  [place], [place] [address] [phone]  Please arrive at the [facility] main entrance of [facility] 30 minutes prior to test start time. Proceed to the [facility] Radiology Department (first floor) to check-in and test prep.   Please follow these instructions carefully (unless otherwise directed):  Hold all erectile  dysfunction medications at least 3 days (72 hrs) prior to test.  On the Night Before the Test: . Be sure to Drink plenty of water. . Do not consume any caffeinated/decaffeinated beverages or chocolate 12 hours prior to your test. . Do not take any antihistamines 12 hours prior to your test. . If you take Metformin do not take 24 hours prior to test.  On the Day of the Test: . Drink plenty of water. Do not drink any water within one hour of the test. . Do not eat any food 4 hours prior to the test. . You may take your regular medications prior to the test.  . Take metoprolol (Lopressor) two hours prior to test. . HOLD Furosemide/Hydrochlorothiazide morning of the test.       After the Test: . Drink plenty of water. . After receiving IV contrast, you may experience a mild flushed feeling. This is normal. . On occasion, you may experience a mild rash up to 24 hours after the test. This is not dangerous. If this occurs, you can take Benadryl 25 mg and increase your fluid intake. . If you experience trouble breathing, this can be serious. If it is severe call 911 IMMEDIATELY. If it is mild, please call our office. . If you take any of these medications: Glipizide/Metformin, Avandament, Glucavance, please do not take 48 hours after completing test unless otherwise instructed.   Once we have confirmed authorization from your insurance company, we will call you to set up a date and time for your test.   For non-scheduling related questions, please contact the cardiac imaging nurse navigator should you have any questions/concerns: Marchia Bond, RN Navigator Cardiac Imaging [facility] Heart and Vascular Services [phone] office        Signed, Fransico Him, MD  01/21/2020 5:44 PM    [address], [place], [place]  [address] Phone: [phone]; Fax: [phone]

## 2020-01-29 ENCOUNTER — Other Ambulatory Visit: Payer: Self-pay | Admitting: Family Medicine

## 2020-01-31 NOTE — Telephone Encounter (Signed)
Electronic refill request:  Valacyclovir Last office visit:   12/03/2019 Last Filled:    60 tablet 3 12/03/2019  Please advise.  PCP is listed as Armanda Magic, MD.  Has patient transferred care?

## 2020-02-01 NOTE — Telephone Encounter (Signed)
Sent. Thanks.  He had seen Dr. Mayford Knife in the past but as far as I know I am still his primary.

## 2020-02-08 ENCOUNTER — Telehealth (HOSPITAL_COMMUNITY): Payer: Self-pay | Admitting: Emergency Medicine

## 2020-02-08 NOTE — Telephone Encounter (Signed)
Reaching out to patient to offer assistance regarding upcoming cardiac imaging study; pt verbalizes understanding of appt date/time, parking situation and where to check in, pre-test NPO status and medications ordered, and verified current allergies; name and call back number provided for further questions should they arise Marlana Mckowen RN Navigator Cardiac Imaging [facility] [phone] office [phone] cell 

## 2020-02-09 ENCOUNTER — Other Ambulatory Visit: Payer: Self-pay

## 2020-02-09 ENCOUNTER — Ambulatory Visit (HOSPITAL_COMMUNITY)
Admission: RE | Admit: 2020-02-09 | Discharge: 2020-02-09 | Disposition: A | Discharge status: Home / Self Care | Payer: BC Managed Care – PPO | Source: Ambulatory Visit | Attending: Cardiology | Admitting: Cardiology

## 2020-02-09 DIAGNOSIS — R072 Precordial pain: Secondary | ICD-10-CM | POA: Diagnosis not present

## 2020-02-09 MED ORDER — IOHEXOL 350 MG/ML SOLN
80.0000 mL | Freq: Once | INTRAVENOUS | Status: AC | PRN
  Administered 2020-02-09: 80 mL via INTRAVENOUS

## 2020-02-09 MED ORDER — METOPROLOL TARTRATE 5 MG/5ML IV SOLN: 5.0000 mg | INTRAVENOUS | Status: DC | PRN

## 2020-02-09 MED ORDER — NITROGLYCERIN 0.4 MG SL SUBL
0.8000 mg | SUBLINGUAL_TABLET | Freq: Once | SUBLINGUAL | Status: AC
  Administered 2020-02-09: 0.8 mg via SUBLINGUAL

## 2020-02-09 MED ORDER — NITROGLYCERIN 0.4 MG SL SUBL
SUBLINGUAL_TABLET | SUBLINGUAL | Status: AC
  Filled 2020-02-09: qty 2

## 2020-02-10 ENCOUNTER — Encounter: Payer: Self-pay | Admitting: Cardiology

## 2020-02-11 ENCOUNTER — Telehealth: Payer: Self-pay

## 2020-02-11 DIAGNOSIS — E7800 Pure hypercholesterolemia, unspecified: Secondary | ICD-10-CM

## 2020-02-11 DIAGNOSIS — I2510 Atherosclerotic heart disease of native coronary artery without angina pectoris: Secondary | ICD-10-CM

## 2020-02-11 MED ORDER — ATORVASTATIN CALCIUM 40 MG PO TABS: 40.0000 mg | ORAL_TABLET | Freq: Every day | ORAL | 3 refills | Status: DC

## 2020-02-11 NOTE — Telephone Encounter (Signed)
-----   Message from Quintella Reichert, MD sent at 02/10/2020  6:29 PM EDT ----- He has evidence of advanced CAD given findings and statin therapy is indicated not only to lower LDL but also for plaque rupture which is what results in acute MI

## 2020-02-11 NOTE — Telephone Encounter (Signed)
Patient agreed to start on Lipitor 40 mg daily. Scheduled for lab work 05/07.

## 2020-03-07 DIAGNOSIS — F3342 Major depressive disorder, recurrent, in full remission: Secondary | ICD-10-CM | POA: Diagnosis not present

## 2020-03-07 DIAGNOSIS — F4322 Adjustment disorder with anxiety: Secondary | ICD-10-CM | POA: Diagnosis not present

## 2020-03-24 ENCOUNTER — Other Ambulatory Visit: Payer: BC Managed Care – PPO | Admitting: *Deleted

## 2020-03-24 ENCOUNTER — Other Ambulatory Visit: Payer: Self-pay

## 2020-03-24 DIAGNOSIS — E7800 Pure hypercholesterolemia, unspecified: Secondary | ICD-10-CM

## 2020-03-24 DIAGNOSIS — I2510 Atherosclerotic heart disease of native coronary artery without angina pectoris: Secondary | ICD-10-CM

## 2020-03-24 LAB — LIPID PANEL
Chol/HDL Ratio: 2.3 ratio (ref 0.0–5.0)
Cholesterol, Total: 109 mg/dL (ref 100–199)
HDL: 47 mg/dL (ref >39)
LDL Chol Calc (NIH): 48 mg/dL (ref 0–99)
Triglycerides: 64 mg/dL (ref 0–149)
VLDL Cholesterol Cal: 14 mg/dL (ref 5–40)

## 2020-03-24 LAB — ALT: ALT: 54 IU/L — ABNORMAL HIGH (ref 0–44)

## 2020-03-30 ENCOUNTER — Other Ambulatory Visit: Payer: Self-pay | Admitting: *Deleted

## 2020-03-30 DIAGNOSIS — E7800 Pure hypercholesterolemia, unspecified: Secondary | ICD-10-CM

## 2020-03-30 MED ORDER — ATORVASTATIN CALCIUM 20 MG PO TABS: 20.0000 mg | ORAL_TABLET | Freq: Every day | ORAL | 3 refills | Status: DC

## 2020-04-11 IMAGING — CT CT CHEST LUNG CANCER SCREENING LOW DOSE W/O CM
2 of 5 series · 15 of 40 positions shown, 18 images · non-contrast
Comparison: 10/05/2015

CLINICAL DATA: Thirty-four pack-year smoking history. Current
smoker.

EXAM:
CT CHEST WITHOUT CONTRAST LOW-DOSE FOR LUNG CANCER SCREENING
TECHNIQUE: Multidetector CT imaging of the chest was performed following the
standard protocol without IV contrast.

[Series 4: lung 1.00 br44 cor · coronal · 0.71mm/px · 3 of 304 slices shown]
[im 61/304  lung]
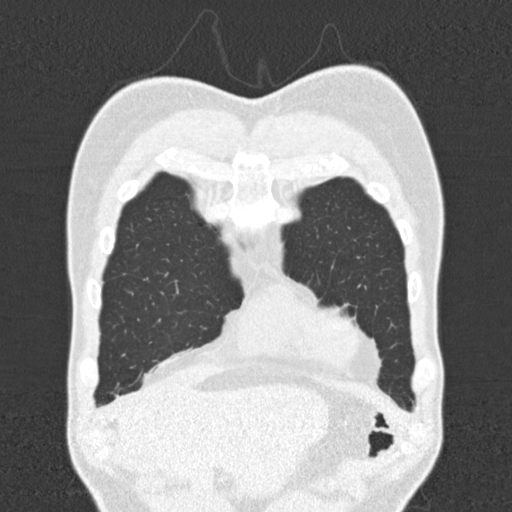
[im 122/304  lung]
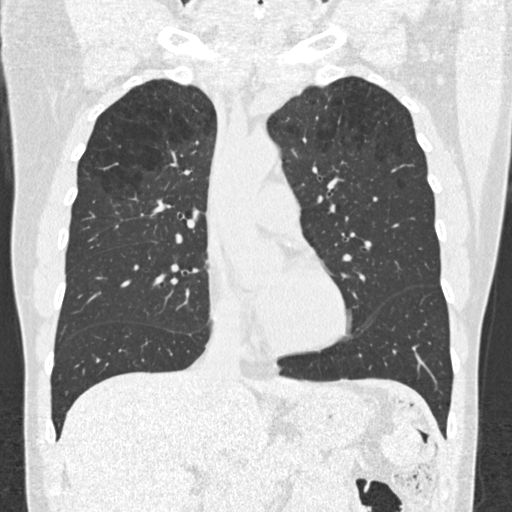
[im 182/304  lung]
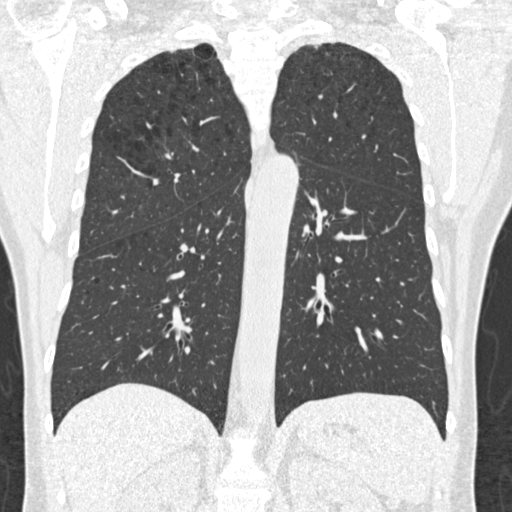

[Series 9: lung 1.00 br60 axial · axial · 0.71mm/px · z∈[-1058,-727]mm · 12 of 365 slices shown, 15 images]
[im 17/365  mediastinal]
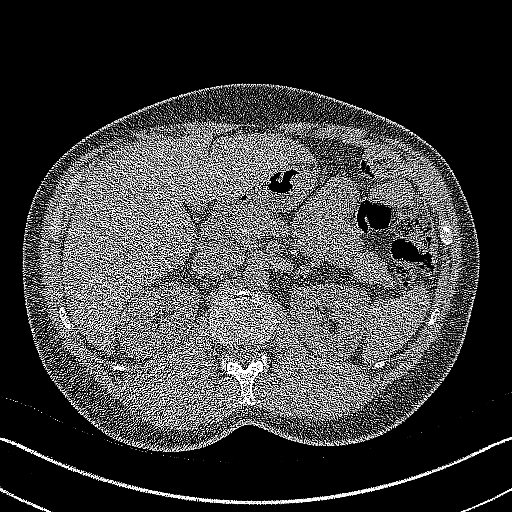
[im 17/365  lung]
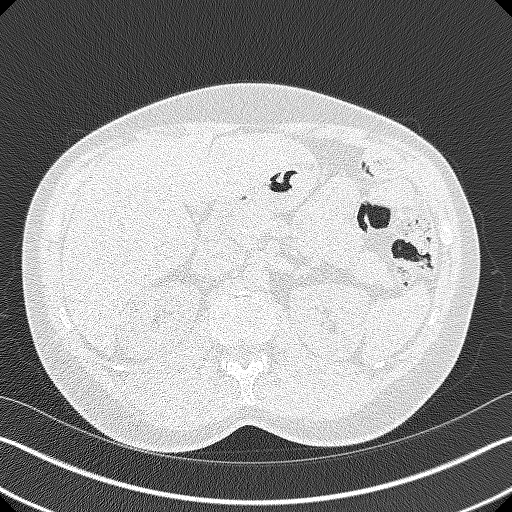
[im 50/365  lung]
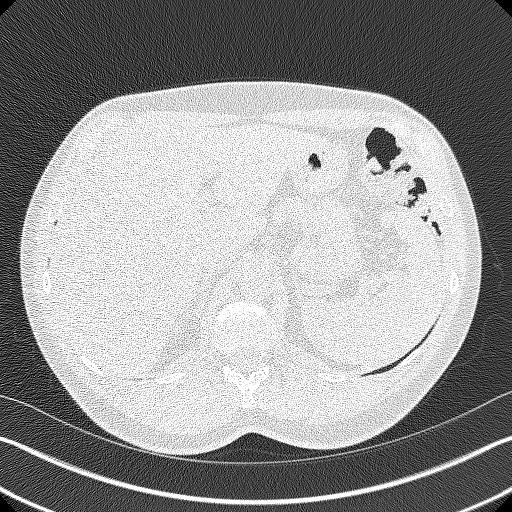
[im 83/365  lung]
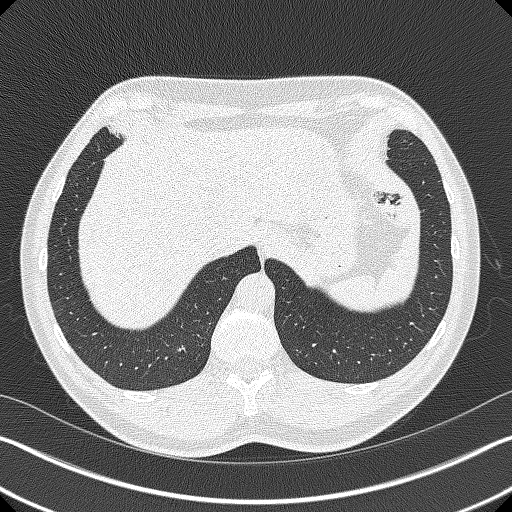
[im 116/365  lung]
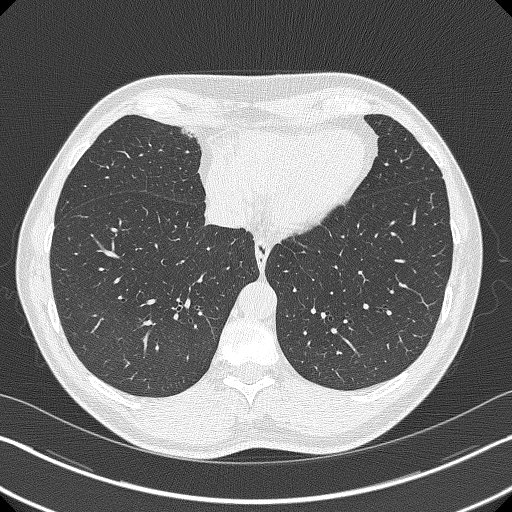
[im 133/365  mediastinal]
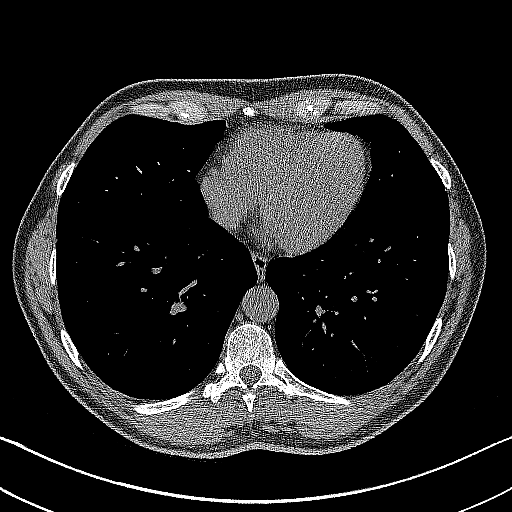
[im 133/365  lung]
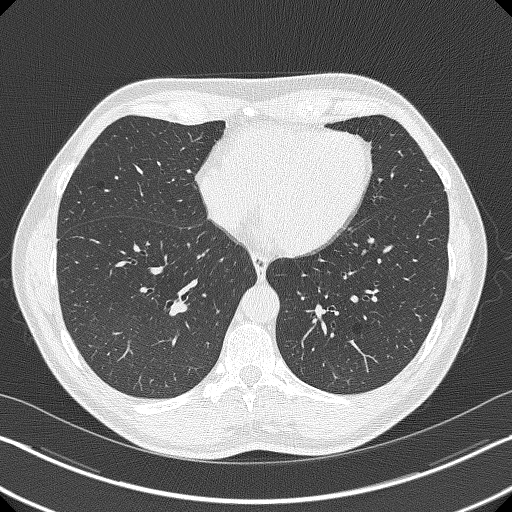
[im 166/365  lung]
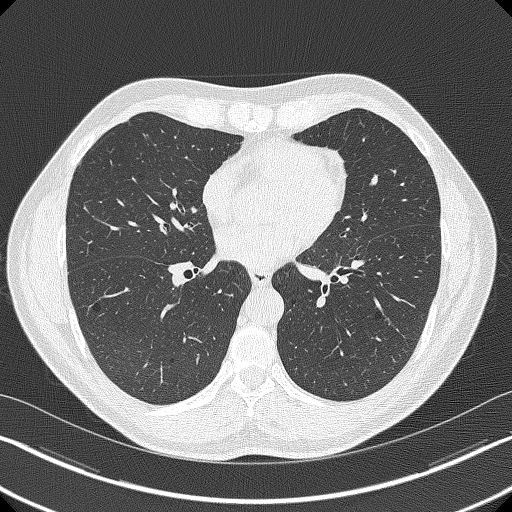
[im 199/365  lung]
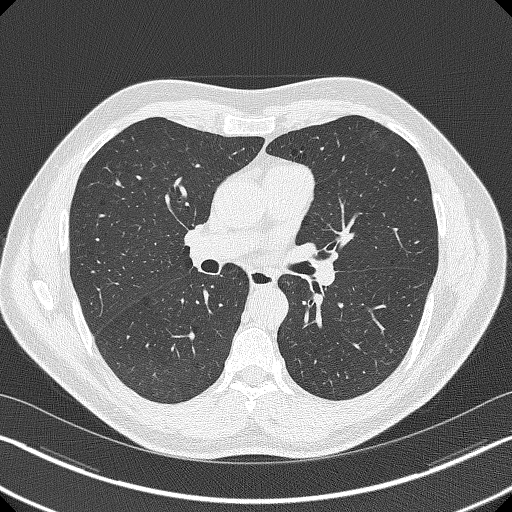
[im 232/365  lung]
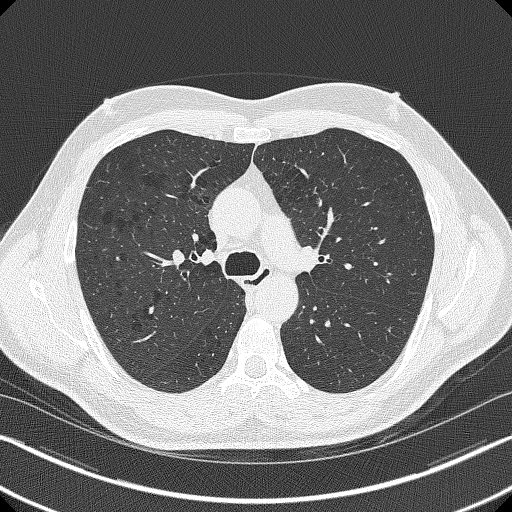
[im 249/365  mediastinal]
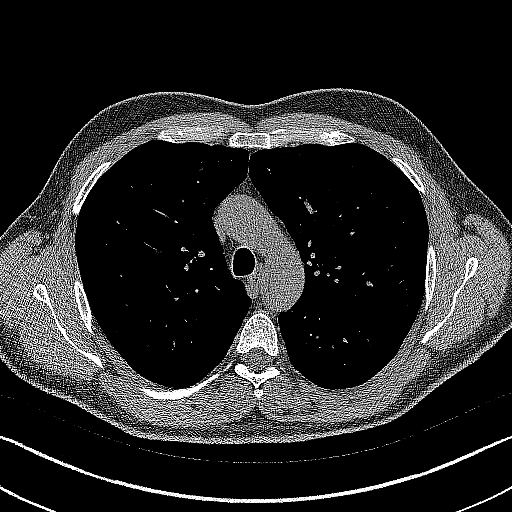
[im 249/365  lung]
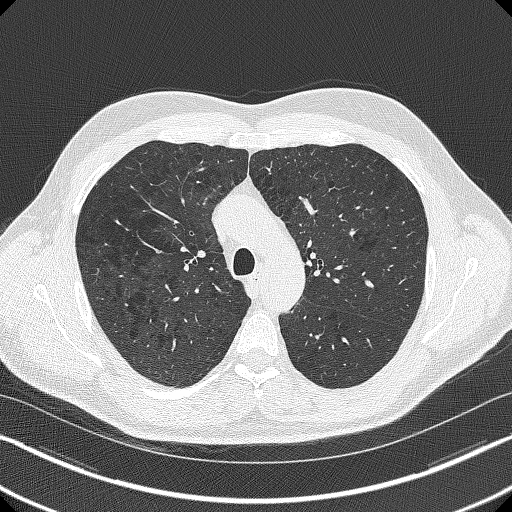
[im 282/365  lung]
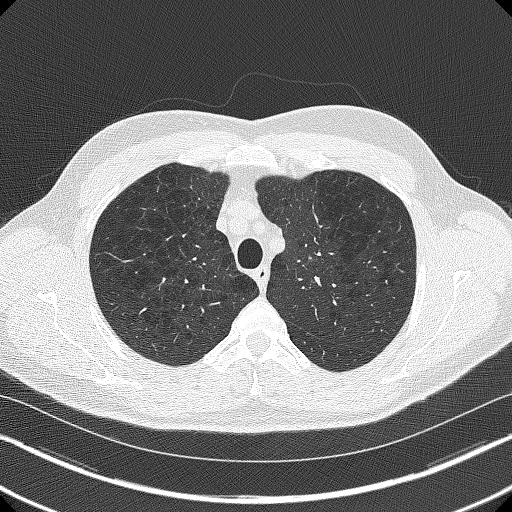
[im 315/365  lung]
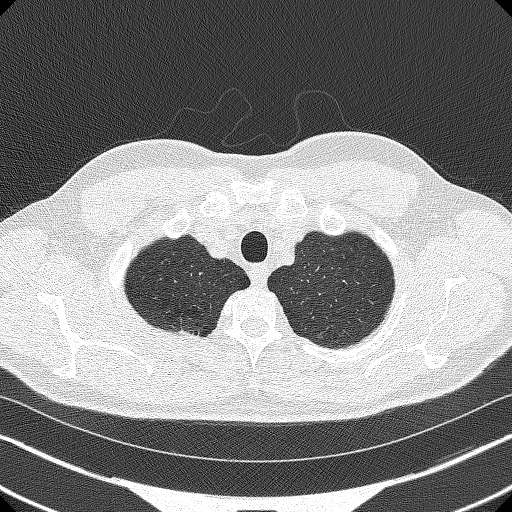
[im 348/365  lung]
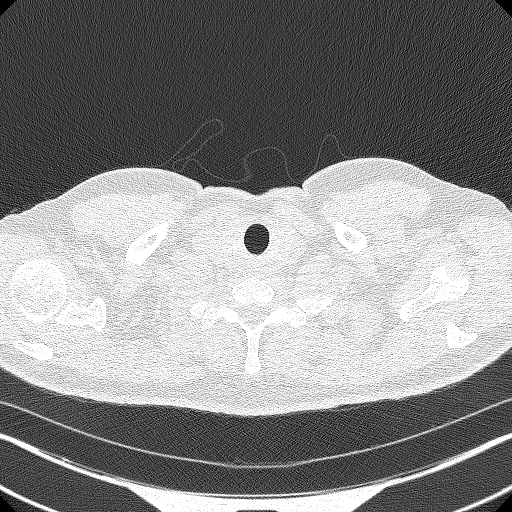

[15 of 40 positions shown; findings below may reference images not displayed]

FINDINGS: Cardiovascular: Normal caliber of the aorta and branch vessels.
Normal heart size, without pericardial effusion. Lad coronary artery
calcification.

Mediastinum/Nodes: No mediastinal or definite hilar adenopathy,
given limitations of unenhanced CT.

Lungs/Pleura: No pleural fluid. Moderate centrilobular emphysema.
Bilateral pulmonary nodules of maximally volume derived equivalent
diameter 3.2 mm are not significantly changed. There is a subpleural
anterior right lower lobe pulmonary nodule of volume derived
equivalent diameter 3.9 mm which is new since the prior exam.

Upper Abdomen: Normal imaged portions of the liver, spleen, stomach,
pancreas, gallbladder, adrenal glands, kidneys.

Musculoskeletal: No acute osseous abnormality.
IMPRESSION: 1. Lung-RADS 2, benign appearance or behavior. Continue annual
screening with low-dose chest CT without contrast in 12 months.
2. Age advanced coronary artery atherosclerosis. Recommend
assessment of coronary risk factors and consideration of medical
therapy.
3.  Emphysema ([id number]).

## 2020-05-11 ENCOUNTER — Other Ambulatory Visit: Payer: BC Managed Care – PPO

## 2020-05-17 ENCOUNTER — Other Ambulatory Visit: Payer: Self-pay

## 2020-05-17 ENCOUNTER — Other Ambulatory Visit: Payer: BC Managed Care – PPO | Admitting: *Deleted

## 2020-05-17 DIAGNOSIS — E7800 Pure hypercholesterolemia, unspecified: Secondary | ICD-10-CM

## 2020-05-17 LAB — LIPID PANEL
Chol/HDL Ratio: 2.3 ratio (ref 0.0–5.0)
Cholesterol, Total: 115 mg/dL (ref 100–199)
HDL: 49 mg/dL (ref >39)
LDL Chol Calc (NIH): 50 mg/dL (ref 0–99)
Triglycerides: 81 mg/dL (ref 0–149)
VLDL Cholesterol Cal: 16 mg/dL (ref 5–40)

## 2020-05-17 LAB — ALT: ALT: 25 IU/L (ref 0–44)

## 2020-07-20 DIAGNOSIS — F3341 Major depressive disorder, recurrent, in partial remission: Secondary | ICD-10-CM | POA: Diagnosis not present

## 2020-07-20 DIAGNOSIS — F4322 Adjustment disorder with anxiety: Secondary | ICD-10-CM | POA: Diagnosis not present

## 2020-07-20 DIAGNOSIS — F410 Panic disorder [episodic paroxysmal anxiety] without agoraphobia: Secondary | ICD-10-CM | POA: Diagnosis not present

## 2020-12-01 ENCOUNTER — Other Ambulatory Visit: Payer: BC Managed Care – PPO

## 2020-12-05 ENCOUNTER — Encounter: Payer: BC Managed Care – PPO | Admitting: Family Medicine

## 2021-03-27 ENCOUNTER — Other Ambulatory Visit: Payer: Self-pay | Admitting: Cardiology

## 2021-08-27 ENCOUNTER — Telehealth: Payer: Self-pay | Admitting: Family Medicine

## 2021-08-27 NOTE — Telephone Encounter (Signed)
Pt calling and has questions about getting his immunization records. He requested a call back at [phone]

## 2021-08-29 NOTE — Telephone Encounter (Signed)
Printed record and mailed per pt request.

## 2023-10-03 DIAGNOSIS — F3342 Major depressive disorder, recurrent, in full remission: Secondary | ICD-10-CM | POA: Diagnosis not present

## 2023-10-03 DIAGNOSIS — F5101 Primary insomnia: Secondary | ICD-10-CM | POA: Diagnosis not present

## 2023-10-03 DIAGNOSIS — F410 Panic disorder [episodic paroxysmal anxiety] without agoraphobia: Secondary | ICD-10-CM | POA: Diagnosis not present

## 2023-12-22 ENCOUNTER — Encounter: Payer: Self-pay | Admitting: Acute Care

## 2024-02-18 ENCOUNTER — Other Ambulatory Visit: Payer: Self-pay

## 2024-02-18 DIAGNOSIS — Z87891 Personal history of nicotine dependence: Secondary | ICD-10-CM

## 2024-02-18 DIAGNOSIS — Z122 Encounter for screening for malignant neoplasm of respiratory organs: Secondary | ICD-10-CM

## 2024-02-18 DIAGNOSIS — F17210 Nicotine dependence, cigarettes, uncomplicated: Secondary | ICD-10-CM

## 2024-02-24 ENCOUNTER — Ambulatory Visit
Admission: RE | Admit: 2024-02-24 | Discharge: 2024-02-24 | Disposition: A | Discharge status: Home / Self Care | Source: Ambulatory Visit | Attending: Family Medicine | Admitting: Family Medicine

## 2024-02-24 DIAGNOSIS — Z122 Encounter for screening for malignant neoplasm of respiratory organs: Secondary | ICD-10-CM

## 2024-02-24 DIAGNOSIS — Z87891 Personal history of nicotine dependence: Secondary | ICD-10-CM | POA: Diagnosis not present

## 2024-02-24 DIAGNOSIS — F17210 Nicotine dependence, cigarettes, uncomplicated: Secondary | ICD-10-CM

## 2024-02-29 DIAGNOSIS — S61012A Laceration without foreign body of left thumb without damage to nail, initial encounter: Secondary | ICD-10-CM | POA: Diagnosis not present

## 2024-02-29 DIAGNOSIS — X58XXXA Exposure to other specified factors, initial encounter: Secondary | ICD-10-CM | POA: Diagnosis not present

## 2024-03-29 ENCOUNTER — Other Ambulatory Visit: Payer: Self-pay

## 2024-03-29 DIAGNOSIS — Z87891 Personal history of nicotine dependence: Secondary | ICD-10-CM

## 2024-03-29 DIAGNOSIS — F17210 Nicotine dependence, cigarettes, uncomplicated: Secondary | ICD-10-CM

## 2024-03-29 DIAGNOSIS — F410 Panic disorder [episodic paroxysmal anxiety] without agoraphobia: Secondary | ICD-10-CM | POA: Diagnosis not present

## 2024-03-29 DIAGNOSIS — F3342 Major depressive disorder, recurrent, in full remission: Secondary | ICD-10-CM | POA: Diagnosis not present

## 2024-03-29 DIAGNOSIS — F5101 Primary insomnia: Secondary | ICD-10-CM | POA: Diagnosis not present

## 2024-03-29 DIAGNOSIS — Z122 Encounter for screening for malignant neoplasm of respiratory organs: Secondary | ICD-10-CM

## 2024-04-26 ENCOUNTER — Ambulatory Visit (INDEPENDENT_AMBULATORY_CARE_PROVIDER_SITE_OTHER): Admitting: Internal Medicine

## 2024-04-26 ENCOUNTER — Encounter: Payer: Self-pay | Admitting: Internal Medicine

## 2024-04-26 VITALS — BP 132/80 | HR 85 | Temp 98.0°F | Ht 69.0 in | Wt 174.8 lb

## 2024-04-26 DIAGNOSIS — K449 Diaphragmatic hernia without obstruction or gangrene: Secondary | ICD-10-CM | POA: Insufficient documentation

## 2024-04-26 DIAGNOSIS — R0683 Snoring: Secondary | ICD-10-CM | POA: Insufficient documentation

## 2024-04-26 DIAGNOSIS — J431 Panlobular emphysema: Secondary | ICD-10-CM | POA: Insufficient documentation

## 2024-04-26 DIAGNOSIS — K22719 Barrett's esophagus with dysplasia, unspecified: Secondary | ICD-10-CM | POA: Diagnosis not present

## 2024-04-26 DIAGNOSIS — E782 Mixed hyperlipidemia: Secondary | ICD-10-CM

## 2024-04-26 DIAGNOSIS — F17200 Nicotine dependence, unspecified, uncomplicated: Secondary | ICD-10-CM

## 2024-04-26 DIAGNOSIS — E785 Hyperlipidemia, unspecified: Secondary | ICD-10-CM | POA: Insufficient documentation

## 2024-04-26 DIAGNOSIS — I270 Primary pulmonary hypertension: Secondary | ICD-10-CM | POA: Insufficient documentation

## 2024-04-26 DIAGNOSIS — R29818 Other symptoms and signs involving the nervous system: Secondary | ICD-10-CM | POA: Insufficient documentation

## 2024-04-26 DIAGNOSIS — J439 Emphysema, unspecified: Secondary | ICD-10-CM | POA: Insufficient documentation

## 2024-04-26 DIAGNOSIS — K0510 Chronic gingivitis, plaque induced: Secondary | ICD-10-CM | POA: Insufficient documentation

## 2024-04-26 DIAGNOSIS — R7989 Other specified abnormal findings of blood chemistry: Secondary | ICD-10-CM

## 2024-04-26 DIAGNOSIS — J438 Other emphysema: Secondary | ICD-10-CM

## 2024-04-26 DIAGNOSIS — I2510 Atherosclerotic heart disease of native coronary artery without angina pectoris: Secondary | ICD-10-CM

## 2024-04-26 DIAGNOSIS — K219 Gastro-esophageal reflux disease without esophagitis: Secondary | ICD-10-CM | POA: Insufficient documentation

## 2024-04-26 MED ORDER — OMEPRAZOLE 20 MG PO CPDR: 20.0000 mg | DELAYED_RELEASE_CAPSULE | Freq: Every day | ORAL | 3 refills | Status: AC

## 2024-04-26 MED ORDER — ROSUVASTATIN CALCIUM 40 MG PO TABS: 40.0000 mg | ORAL_TABLET | Freq: Every day | ORAL | 3 refills | Status: AC

## 2024-04-26 MED ORDER — OMEPRAZOLE 40 MG PO CPDR: 40.0000 mg | DELAYED_RELEASE_CAPSULE | Freq: Every day | ORAL | 3 refills | Status: DC

## 2024-04-26 MED ORDER — CHLORHEXIDINE GLUCONATE 0.12 % MT SOLN: 15.0000 mL | Freq: Two times a day (BID) | OROMUCOSAL | 11 refills | Status: DC

## 2024-04-26 MED ORDER — CHLORHEXIDINE GLUCONATE 0.12 % MT SOLN: 15.0000 mL | Freq: Two times a day (BID) | OROMUCOSAL | 0 refills | Status: DC

## 2024-04-26 MED ORDER — ASPIRIN 81 MG PO TBEC: 162.0000 mg | DELAYED_RELEASE_TABLET | Freq: Every day | ORAL | 3 refills | Status: AC

## 2024-04-26 NOTE — Assessment & Plan Note (Signed)
 Long-standing Barrett's esophagus with a history of hiatal hernia requires regular surveillance endoscopies to monitor for esophageal cancer. He expressed concern about cancer risk and agreed to follow up with gastroenterology. Refer to gastroenterology for endoscopy.

## 2024-04-26 NOTE — Assessment & Plan Note (Signed)
 Chronic gingivitis raises concerns about potential impact on heart health. Managing oral health is important to prevent bacterial endocarditis. He uses bleach water for gums but agreed to use prescription mouthwash. Prescribe chlorhexidine mouthwash (Peridex).

## 2024-04-26 NOTE — Patient Instructions (Addendum)
 VISIT SUMMARY:  Today, we reviewed your recent pulmonary test results and discussed the management of your chronic conditions, including pulmonary hypertension, COPD, coronary artery disease, Barrett's esophagus, and gingivitis. We also talked about the importance of regular health maintenance and follow-up with specialists.  YOUR PLAN:  -PULMONARY HYPERTENSION: Pulmonary hypertension is high blood pressure in the lungs' arteries, often due to lung damage from smoking. We discussed the potential causes and treatments, including CPAP, valve repair, and new medications. You will be referred to pulmonology and cardiology for further evaluation and management. An ENT specialist will assess you for potential sleep apnea, and CPAP may be considered if sleep apnea is confirmed. It's crucial to stop smoking to improve your prognosis.  -CHRONIC OBSTRUCTIVE PULMONARY DISEASE (COPD): COPD is a lung disease caused by long-term smoking, leading to lung damage and breathing difficulties. Recent CT scans showed lung nodules and emphysema. We discussed the importance of quitting smoking to prevent further lung damage. You will be referred to pulmonology for further evaluation and management of your COPD.  -CORONARY ARTERY DISEASE: Coronary artery disease is a condition where the heart's blood vessels are narrowed or blocked. You have been managing this with atorvastatin , but we discussed switching to rosuvastatin for better cholesterol management, and you agreed to the change.  -BARRETT'S ESOPHAGUS: Barrett's esophagus is a condition where the lining of the esophagus changes, increasing the risk of esophageal cancer. You have a history of this condition along with a hiatal hernia. Regular surveillance endoscopies are necessary to monitor for cancer. You will follow up with gastroenterology for an endoscopy.  -GINGIVITIS: Gingivitis is gum inflammation that can affect heart health if not managed properly. You have been  using bleach water for your gums, but we agreed that you will switch to a prescription mouthwash. I will prescribe chlorhexidine mouthwash (Peridex) for you.  -GENERAL HEALTH MAINTENANCE: Regular health maintenance, including vaccinations and screenings, is important. We recommend getting a flu shot due to your lung issues. Schedule a physical examination in four months and get a flu shot during flu season.  INSTRUCTIONS:  Please ensure timely follow-up with pulmonology, cardiology, gastroenterology, and ENT specialists. Contact our office if your appointments are not scheduled within one month. Schedule a physical examination in four months and get a flu shot during flu season.  An enlarged pulmonic trunk, as noted in your medical report, is indicative of pulmonary arterial hypertension (PAH). This condition is characterized by elevated blood pressure in the arteries of the lungs, leading to increased workload on the right side of the heart. Over time, this can result in right heart failure and other serious complications.(en.wikipedia.org) Prognosis of Pulmonary Arterial Hypertension The prognosis for PAH varies based on several factors, including the underlying cause, the severity of symptoms, and the patient's response to treatment. Without treatment, the median survival for idiopathic PAH is approximately 2-3 years. However, with appropriate medical management, survival rates have improved. For instance, the [employer] reported 1-, 3-, and 5-year survival rates of 88%, 72%, and 57%, respectively, for patients diagnosed with PAH .([email or web address], [email or web address].pl) Key Prognostic Factors Several factors influence the prognosis of PAH: Functional Class: Patients in World Health Organization [facility]) Functional Class I or II, which indicates less severe symptoms, generally have better outcomes compared to those in Class III or IV .(publications.ersnet.org) Right Ventricular Function: The  performance of the right side of the heart is a critical determinant of survival. Deterioration in right ventricular function is a common cause of death in [facility] patients .(  verywellhealth.com, RentRule.si) Six-Minute Walk Distance ( ): A longer is associated with better outcomes. Patients with a of 440 meters or more have improved survival rates .(ahajournals.org) Biomarkers: Lower levels of brain natriuretic peptide (BNP) are favorable, as elevated BNP levels are associated with worse outcomes .(ahajournals.org) Treatment Options While there is no cure for PAH, several treatments can help manage symptoms and improve quality of life: Medications: Drugs such as endothelin receptor antagonists (e.g., bosentan), phosphodiesterase-5 inhibitors (e.g., sildenafil ), and prostacyclin analogs (e.g., epoprostenol) are commonly used to dilate blood vessels and reduce pulmonary artery pressure. Sotatercept (Winrevair): A newer treatment approved in 2024, sotatercept has shown promise in improving exercise capacity and reducing clinical deterioration in [facility] patients .([email or web address]-[email or web address]/) Lifestyle Modifications: Patients are advised to avoid pregnancy due to high maternal mortality rates, adhere to a low-salt diet to manage fluid retention, and engage in appropriate physical activity to maintain cardiovascular health .(RentRule.si) Advanced Therapies: In severe cases, options such as lung transplantation may be considered. Survival rates post-transplant vary, but recent data suggests 5- and 10-year survival rates of 50-75% and 43-66%, respectively .(publications.ersnet.org) Recommendations Given the complexity of PAH and its potential progression, it's crucial to maintain regular follow-up appointments with your healthcare provider. Monitoring tools like echocardiograms, BNP levels, and the test can help assess disease progression and treatment efficacy. Early intervention and adherence to  prescribed therapies are key to managing the condition effectively.(ahajournals.org) If you have specific concerns about your diagnosis or treatment plan, it's important to discuss them with your healthcare provider, who can offer personalized advice and support.

## 2024-04-26 NOTE — Assessment & Plan Note (Signed)
 COPD is linked to long-term smoking, with recent CT scans showing lung nodules and emphysema. The impact of smoking on lung health and the importance of cessation were discussed. He intends to quit smoking after an upcoming funeral. Refer to pulmonology for further evaluation and management of COPD. Emphasize smoking cessation to prevent further lung damage.

## 2024-04-26 NOTE — Assessment & Plan Note (Signed)
 Newly diagnosed pulmonary hypertension, likely due to smoking-related lung damage, raises concerns about an enlarged pulmonic trunk. Potential causes such as valve issues or sleep apnea were discussed. Addressing this condition is crucial due to its high risk if untreated, with a five-year survival rate of 57%. Potential treatments include CPAP, valve repair, and new medications. Refer to pulmonology and cardiology for further evaluation and management. An ENT specialist will assess for potential sleep apnea. Consider CPAP if sleep apnea is confirmed. Emphasize smoking cessation to improve prognosis.

## 2024-04-26 NOTE — Progress Notes (Signed)
 [facility]  Phone: [phone]  - Medical Office Visit -  Visit Date: 04/26/2024 Patient: Gregory Wagner   DOB: 11-05-1966   58 y.o. Male  MRN: [id number] Patient Care Team: Anthon Kins, MD as PCP - General (Internal Medicine) Daphine Eagle, MD as Consulting Physician (Psychiatry) Today's Health Care Provider: Anthon Kins, MD  ===========================================    Chief Complaint / Reason for Visit: new pt (Pt is present to est care with pcp would like to discuss a scan he had.)   Background: 58 y.o. male who has Mixed anxiety and depressive disorder; Barrett's esophagus; Smoker; CAD (coronary artery disease); Gingivitis; Pulmonary hypertension, primary (HCC); Hyperlipidemia; Emphysema lung (HCC); Gastro-esophageal reflux disease without esophagitis; Hiatal hernia; LFT elevation; Snoring; and Suspected sleep apnea on their problem list.  Discussed the use of AI scribe software for clinical note transcription with the patient, who gave verbal consent to proceed.  History of Present Illness 58 year old male with coronary artery disease, COPD, and pulmonary hypertension who presents for evaluation of recent pulmonary test results and management of multiple chronic conditions.  He has a history of coronary artery disease diagnosed in 2021 following a routine lung CT scan. He has been on atorvastatin  40 mg daily since then, with cholesterol levels within normal limits, and a recent reading of 160 mg/dL.  He has a history of COPD and was a long-term smoker, quitting last year. He has a 14-year pack history with periods of cessation. Recent CT scans have shown pulmonary nodules and an enlarged pulmonic trunk, prompting further evaluation.  He has been diagnosed with Barrett's esophagus and a hiatal hernia, managed with omeprazole 20 mg daily. He experiences heartburn if he misses a dose and is concerned about the risk of esophageal cancer.  He has  a history of anxiety and depression, managed with Wellbutrin and Xanax, and takes trazodone for sleep. He reports a past issue with alcohol but has been abstinent for several years.  He has a family history of stroke, with his mother having had a stroke at 51. He takes aspirin 81 mg twice daily as a preventive measure.  He reports a history of gingivitis and uses bleach water for his gums, aware of the potential cardiac implications of gum disease.  He has a history of hair loss, takes oral minoxidil, and underwent a hair transplant. He reports occasional smoking relapse post-procedure.  He experiences snoring and nasal congestion, attributed to allergies and a deviated septum.   Problem overviews updated today: Problem  Gingivitis  Pulmonary Hypertension, Primary (Hcc)  Hyperlipidemia  Emphysema Lung (Hcc)  Gastro-Esophageal Reflux Disease Without Esophagitis  Hiatal Hernia  Lft Elevation  Snoring  Suspected Sleep Apnea  Smoker   Close to complete abstinence Social History   Tobacco Use  Smoking Status Every Day   Current packs/day: 1.00   Average packs/day: 1 pack/day for 34.0 years (34.0 ttl pk-yrs)   Types: Cigarettes  Smokeless Tobacco Never  Tobacco Comments   Working on stop smoking does not smoke pack a day no more.      Mixed Anxiety and Depressive Disorder  Barrett's Esophagus   Diagnosed 2008 Gets dyspepsia if misses omeprazole Last esophagoduodenoscopy 2020   Cold Sore (Resolved)  Erectile Dysfunction (Resolved)    Medications updated/reviewed: Current Outpatient Medications on File Prior to Visit  Medication Sig   ALPRAZolam (XANAX) 1 MG tablet Take 0.5 mg by mouth at bedtime.    atorvastatin  (LIPITOR) 40 MG tablet Take  40 mg by mouth daily.   buPROPion (WELLBUTRIN XL) 300 MG 24 hr tablet Take 300 mg by mouth daily.   Multiple Vitamin (MULTIVITAMIN) tablet Take 1 tablet by mouth daily.   traZODone (DESYREL) 150 MG tablet Take one half tablet by mouth  daily   No current facility-administered medications on file prior to visit.   Medications Discontinued During This Encounter  Medication Reason   omeprazole (PRILOSEC) 20 MG capsule Dose change   tretinoin  (RETIN-A ) 0.1 % cream Completed Course   atorvastatin  (LIPITOR) 20 MG tablet Completed Course   nefazodone (SERZONE) 200 MG tablet Completed Course   metoprolol  tartrate (LOPRESSOR ) 100 MG tablet Completed Course   valACYclovir  (VALTREX ) 1000 MG tablet Completed Course   omeprazole (PRILOSEC) 40 MG capsule Reorder   chlorhexidine (PERIDEX) 0.12 % solution    omeprazole (PRILOSEC) 40 MG capsule    aspirin 325 MG tablet Completed Course   Current Meds  Medication Sig   ALPRAZolam (XANAX) 1 MG tablet Take 0.5 mg by mouth at bedtime.    aspirin EC 81 MG tablet Take 2 tablets (162 mg total) by mouth daily. Swallow whole.   atorvastatin  (LIPITOR) 40 MG tablet Take 40 mg by mouth daily.   buPROPion (WELLBUTRIN XL) 300 MG 24 hr tablet Take 300 mg by mouth daily.   Multiple Vitamin (MULTIVITAMIN) tablet Take 1 tablet by mouth daily.   omeprazole (PRILOSEC) 20 MG capsule Take 1 capsule (20 mg total) by mouth daily.   rosuvastatin (CRESTOR) 40 MG tablet Take 1 tablet (40 mg total) by mouth daily. Replaces atorvastatin    traZODone (DESYREL) 150 MG tablet Take one half tablet by mouth daily   [DISCONTINUED] chlorhexidine (PERIDEX) 0.12 % solution Use as directed 15 mLs in the mouth or throat 2 (two) times daily.   [DISCONTINUED] omeprazole (PRILOSEC) 20 MG capsule Take 20 mg by mouth daily.   [DISCONTINUED] omeprazole (PRILOSEC) 40 MG capsule Take 40 mg by mouth daily.   [DISCONTINUED] valACYclovir  (VALTREX ) 1000 MG tablet TAKE 2 TABLETS (2,000 MG TOTAL) BY MOUTH 2 (TWO) TIMES DAILY. FOR ONE DAY    Allergies:   Allergies as of 04/26/2024 - Review Complete 04/26/2024  Allergen Reaction Noted   Doxycycline Hives 06/29/2013   Sulfa antibiotics Hives 06/29/2013   Varenicline  07/29/2014    Past Medical History:  has a past medical history of Barrett's esophagus, CAD (coronary artery disease), Cold sore (10/07/2014), Depression, Elevated WBC count, and GERD (gastroesophageal reflux disease). Past Surgical History:   has a past surgical history that includes Pilonidal cyst excision and chin implant. Social History:   reports that he has been smoking cigarettes. He has a 34 pack-year smoking history. He has never used smokeless tobacco. He reports that he does not currently use alcohol. He reports that he does not use drugs. Family History:  family history includes Alcohol abuse in his father; COPD in his mother; Depression in his mother; Heart disease in his father; Stroke in his mother. Depression Screen and Health Maintenance:    04/26/2024    9:09 AM 11/21/2017   11:26 AM  PHQ 2/9 Scores  PHQ - 2 Score 0 0  PHQ- 9 Score 0    Health Maintenance  Topic Date Due   Pneumococcal Vaccine 64-42 Years old (2 of 2 - PPSV23) 01/14/2012   Zoster Vaccines- Shingrix (1 of 2) Never done   COVID-19 Vaccine (1 - 2024-25 season) Never done   INFLUENZA VACCINE  06/18/2024   Lung Cancer Screening  02/23/2025  Colonoscopy  11/06/2025   DTaP/Tdap/Td (3 - Td or Tdap) 02/28/2034   Hepatitis C Screening  Completed   HIV Screening  Completed   HPV VACCINES  Aged Out   Meningococcal B Vaccine  Aged Out   Immunization History  Administered Date(s) Administered   Influenza,inj,Quad PF,6+ Mos 11/07/2015, 09/05/2018, 12/03/2019   Influenza,inj,quad, With Preservative 09/16/2014   Influenza-Unspecified 08/22/2017, 08/18/2018   Pneumococcal Conjugate-13 11/19/2011   Tdap 06/29/2013, 02/29/2024     Objective   Physical ExamBP 132/80   Pulse 85   Temp 98 F (36.7 C) (Temporal)   Ht 5\' 9"  (1.753 m)   Wt 174 lb 12.8 oz (79.3 kg)   SpO2 98%   BMI 25.81 kg/m  Wt Readings from Last 10 Encounters:  04/26/24 174 lb 12.8 oz (79.3 kg)  01/21/20 183 lb 3.2 oz (83.1 kg)  01/05/20 185 lb (83.9  kg)  12/03/19 186 lb (84.4 kg)  11/27/18 174 lb 12.8 oz (79.3 kg)  11/21/17 177 lb 8 oz (80.5 kg)  08/05/17 175 lb 12 oz (79.7 kg)  07/04/17 180 lb 12 oz (82 kg)  11/19/16 186 lb (84.4 kg)  11/07/15 178 lb (80.7 kg)  Vital signs reviewed.  Nursing notes reviewed. Weight trend reviewed. Abnormalities and problem-specific physical exam findings:  very mild truncal adiposity  General Appearance:  Well developed, well nourished, well-groomed, healthy-appearing male with Body mass index is 25.81 kg/m. No acute distress appreciable.   Skin: Clear and well-hydrated. Pulmonary:  Normal work of breathing at rest, no respiratory distress apparent. SpO2: 98 %  Musculoskeletal: He demonstrates smooth and coordinated movements throughout all major joints.All extremities are intact.  Neurological:  Awake, alert, oriented, and engaged.  No obvious focal neurological deficits or cognitive impairments.  Sensorium seems unclouded.  Psychiatric:  Appropriate mood, pleasant and cooperative demeanor, cheerful and engaged during the exam  Reviewed Results & Data Results LABS Cholesterol: 160 (12/2023) full labs in [place] normal  RADIOLOGY Chest CT: Pulmonary hypertension, emphysema, pulmonary nodules, enlarged pulmonic trunk (02/2024) Coronary CT: Coronary artery disease, LAD and circumflex coronary artery calcification (2021)     No results found for any visits on 04/26/24.  No visits with results within 1 Year(s) from this visit.  Latest known visit with results is:  Lab on 05/17/2020  Component Date Value   Cholesterol, Total 05/17/2020 115    Triglycerides 05/17/2020 81    HDL 05/17/2020 49    VLDL Cholesterol Cal 05/17/2020 16    LDL Chol Calc (NIH) 05/17/2020 50    Chol/HDL Ratio 05/17/2020 2.3    ALT 05/17/2020 25    No image results found.   CT CHEST LUNG CA SCREEN LOW DOSE W/O CM Result Date: 03/26/2024 CLINICAL DATA:  Thirty-eight pack-year smoker. EXAM: CT CHEST WITHOUT CONTRAST  LOW-DOSE FOR LUNG CANCER SCREENING TECHNIQUE: Multidetector CT imaging of the chest was performed following the standard protocol without IV contrast. RADIATION DOSE REDUCTION: This exam was performed according to the departmental dose-optimization program which includes automated exposure control, adjustment of the mA and/or kV according to patient size and/or use of iterative reconstruction technique. COMPARISON:  01/05/2020. FINDINGS: Cardiovascular: Age advanced left anterior descending and circumflex coronary artery calcification. Enlarged pulmonic trunk. Heart size normal. No pericardial effusion. Mediastinum/Nodes: No pathologically enlarged mediastinal or axillary lymph nodes. Hilar regions are difficult to definitively evaluate without IV contrast. Esophagus is grossly unremarkable. Lungs/Pleura: Centrilobular and paraseptal emphysema. Pulmonary nodules measure 3.6 mm or less in size, as before. No pleural fluid.  Airway is unremarkable. Upper Abdomen: Visualized portions of the liver, adrenal glands, kidneys, spleen, pancreas, stomach and bowel are grossly unremarkable. No upper abdominal adenopathy. Musculoskeletal: None. IMPRESSION: 1. Lung-RADS 2, benign appearance or behavior. Continue annual screening with low-dose chest CT without contrast in 12 months. 2. Age advanced 2 vessel coronary artery calcification. 3. Enlarged pulmonic trunk, indicative of pulmonary arterial hypertension. 4.  Emphysema (ICD10-J43.9). Electronically Signed   By: Shearon Denis M.D.   On: 03/26/2024 13:50        Assessment & Plan Pulmonary hypertension, primary (HCC) Newly diagnosed pulmonary hypertension, likely due to smoking-related lung damage, raises concerns about an enlarged pulmonic trunk. Potential causes such as valve issues or sleep apnea were discussed. Addressing this condition is crucial due to its high risk if untreated, with a five-year survival rate of 57%. Potential treatments include CPAP, valve  repair, and new medications. Refer to pulmonology and cardiology for further evaluation and management. An ENT specialist will assess for potential sleep apnea. Consider CPAP if sleep apnea is confirmed. Emphasize smoking cessation to improve prognosis. Gingivitis Chronic gingivitis raises concerns about potential impact on heart health. Managing oral health is important to prevent bacterial endocarditis. He uses bleach water for gums but agreed to use prescription mouthwash. Prescribe chlorhexidine mouthwash (Peridex). Mixed hyperlipidemia  Other emphysema (HCC) COPD is linked to long-term smoking, with recent CT scans showing lung nodules and emphysema. The impact of smoking on lung health and the importance of cessation were discussed. He intends to quit smoking after an upcoming funeral. Refer to pulmonology for further evaluation and management of COPD. Emphasize smoking cessation to prevent further lung damage. Barrett's esophagus with dysplasia Long-standing Barrett's esophagus with a history of hiatal hernia requires regular surveillance endoscopies to monitor for esophageal cancer. He expressed concern about cancer risk and agreed to follow up with gastroenterology. Refer to gastroenterology for endoscopy. Snoring  Suspected sleep apnea  Smoker  Coronary artery disease due to calcified coronary lesion Coronary artery disease, identified in 2021, is currently managed with atorvastatin . Discussed switching to rosuvastatin for better cholesterol management, and he agreed to the change. Switch atorvastatin  to rosuvastatin. LFT elevation  Hiatal hernia  Coronary artery disease due to lipid rich plaque  General Health Maintenance   Regular health maintenance, including vaccinations and screenings, is important. A flu shot is recommended due to lung issues. Schedule a physical examination in four months and administer a flu shot during flu season. Follow-up   Timely follow-up with  multiple specialists is crucial for effective management. He should contact the office if appointments are not scheduled within one month. Ensure follow-up appointments with pulmonology, cardiology, gastroenterology, and ENT. Monitor appointment scheduling and contact the office if not scheduled within one month.  Recording duration: 37 minutes      Orders Placed During this Encounter:   Ambulatory referral to Gastroenterology         Ambulatory referral to Pulmonology         Ambulatory referral to Cardiology         Ambulatory referral to ENT         chlorhexidine (PERIDEX) 0.12 % solution  2 times daily,   Status:  Discontinued         rosuvastatin (CRESTOR) 40 MG tablet  Daily         omeprazole (PRILOSEC) 40 MG capsule  Daily,   Status:  Discontinued         chlorhexidine (PERIDEX) 0.12 % solution  2 times  daily         omeprazole (PRILOSEC) 20 MG capsule  Daily         aspirin EC 81 MG tablet  Daily           Recommended follow up: Return in about 4 months (around 08/26/2024). Future Appointments  Date Time Provider Department Center  08/26/2024  8:20 AM Anthon Kins, MD [facility]         Additional notes: This document was synthesized by artificial intelligence ([employer]) using HIPAA-compliant recording of the clinical interaction;   We discussed the use of AI scribe software for clinical note transcription with the patient, who gave verbal consent to proceed.    Additional Info: This encounter employed state-of-the-art, real-time, collaborative documentation. The patient actively reviewed and assisted in updating their electronic medical record on a shared screen, ensuring transparency and facilitating joint problem-solving for the problem list, overview, and plan. This approach promotes accurate, informed care. The treatment plan was discussed and reviewed in detail, including medication safety, potential side effects, and all patient questions. We confirmed  understanding and comfort with the plan. Follow-up instructions were established, including contacting the office for any concerns, returning if symptoms worsen, persist, or new symptoms develop, and precautions for potential emergency department visits.  Initial Appointment Goals:  This initial visit focused on establishing a foundation for the patient's care. We collaboratively reviewed his medical history and medications in detail, updating the chart as shown in the encounter. Given the extensive information, we prioritized addressing his most pressing concerns, which he reported were: new pt (Pt is present to est care with pcp would like to discuss a scan he had.)  While the complexity of the patient's medical picture may necessitate further evaluation in subsequent visits, we were able to develop a preliminary care plan together. To expedite a comprehensive plan at the next visit, we encouraged the patient to gather relevant medical records from previous providers. This collaborative approach will ensure a more complete understanding of the patient's health and inform the development of a personalized care plan. We look forward to continuing the conversation and working together with the patient on achieving his health goals.   Collaborative Documentation:  Today's encounter utilized real-time, dynamic patient engagement.  Patients actively participate by directly reviewing and assisting in updating their medical records through a shared screen. This transparency empowers patients to visually confirm chart updates made by the healthcare provider.  This collaborative approach facilitates problem management as we jointly update the problem list, problem overview, and assessment/plan. Ultimately, this process enhances chart accuracy and completeness, fostering shared decision-making, patient education, and informed consent for tests and treatments.  Collaborative Treatment Planning:  Treatment plans were  discussed and reviewed in detail.  Explained medication safety and potential side effects.  Encouraged participation and answered all patient questions, confirming understanding and comfort with the plan. Encouraged patient to contact our office if they have any questions or concerns. Agreed on patient returning to office if symptoms worsen, persist, or new symptoms develop.  ----------------------------------------------------- Anthon Kins, MD  04/26/2024 10:08 AM  [facility]:  [phone]

## 2024-04-26 NOTE — Assessment & Plan Note (Signed)
 Coronary artery disease, identified in 2021, is currently managed with atorvastatin . Discussed switching to rosuvastatin for better cholesterol management, and he agreed to the change. Switch atorvastatin  to rosuvastatin.

## 2024-05-18 ENCOUNTER — Other Ambulatory Visit: Payer: Self-pay | Admitting: Medical Genetics

## 2024-05-19 ENCOUNTER — Encounter: Payer: Self-pay | Admitting: Gastroenterology

## 2024-05-25 ENCOUNTER — Other Ambulatory Visit (HOSPITAL_COMMUNITY)

## 2024-05-25 ENCOUNTER — Encounter (INDEPENDENT_AMBULATORY_CARE_PROVIDER_SITE_OTHER): Payer: Self-pay | Admitting: Otolaryngology

## 2024-06-02 ENCOUNTER — Other Ambulatory Visit

## 2024-06-02 DIAGNOSIS — Z006 Encounter for examination for normal comparison and control in clinical research program: Secondary | ICD-10-CM

## 2024-06-14 LAB — GENECONNECT MOLECULAR SCREEN: Genetic Analysis Overall Interpretation: NEGATIVE

## 2024-07-12 ENCOUNTER — Encounter: Payer: Self-pay | Admitting: Gastroenterology

## 2024-07-12 ENCOUNTER — Ambulatory Visit: Admitting: Gastroenterology

## 2024-07-12 VITALS — BP 114/70 | HR 82 | Ht 69.0 in | Wt 177.0 lb

## 2024-07-12 DIAGNOSIS — K219 Gastro-esophageal reflux disease without esophagitis: Secondary | ICD-10-CM | POA: Diagnosis not present

## 2024-07-12 DIAGNOSIS — R194 Change in bowel habit: Secondary | ICD-10-CM

## 2024-07-12 DIAGNOSIS — K2270 Barrett's esophagus without dysplasia: Secondary | ICD-10-CM | POA: Diagnosis not present

## 2024-07-12 DIAGNOSIS — R140 Abdominal distension (gaseous): Secondary | ICD-10-CM

## 2024-07-12 DIAGNOSIS — R143 Flatulence: Secondary | ICD-10-CM

## 2024-07-12 DIAGNOSIS — Z860100 Personal history of colon polyps, unspecified: Secondary | ICD-10-CM

## 2024-07-12 MED ORDER — NA SULFATE-K SULFATE-MG SULF 17.5-3.13-1.6 GM/177ML PO SOLN: 1.0000 | Freq: Once | ORAL | 0 refills | Status: AC

## 2024-07-12 NOTE — Patient Instructions (Addendum)
 Consistency of stools  Recommend high fiber diet  GERD GERD diet Continue Omeprazole    You have been scheduled for an endoscopy and colonoscopy. Please follow the written instructions given to you at your visit today.  If you use inhalers (even only as needed), please bring them with you on the day of your procedure.  DO NOT TAKE 7 DAYS PRIOR TO TEST- Trulicity (dulaglutide) Ozempic, Wegovy (semaglutide) Mounjaro (tirzepatide) Bydureon Bcise (exanatide extended release)  DO NOT TAKE 1 DAY PRIOR TO YOUR TEST Rybelsus (semaglutide) Adlyxin (lixisenatide) Victoza (liraglutide) Byetta (exanatide) ______________________________________________________________________  _______________________________________________________  If your blood pressure at your visit was 140/90 or greater, please contact your primary care physician to follow up on this.  _______________________________________________________  If you are age 21 or older, your body mass index should be between 23-30. Your Body mass index is 26.14 kg/m. If this is out of the aforementioned range listed, please consider follow up with your Primary Care Provider.  If you are age 32 or younger, your body mass index should be between 19-25. Your Body mass index is 26.14 kg/m. If this is out of the aformentioned range listed, please consider follow up with your Primary Care Provider.   ________________________________________________________  The [facility] providers would like to encourage you to use MYCHART to communicate with providers for non-urgent requests or questions.  Due to long hold times on the telephone, sending your provider a message by [facility] may be a faster and more efficient way to get a response.  Please allow 48 business hours for a response.  Please remember that this is for non-urgent requests.  _______________________________________________________  [facility] is using a team-based  approach to care.  Your team is made up of your doctor and two to three APPS. Our APPS (Nurse Practitioners and Physician Assistants) work with your physician to ensure care continuity for you. They are fully qualified to address your health concerns and develop a treatment plan. They communicate directly with your gastroenterologist to care for you. Seeing the Advanced Practice Practitioners on your physician's team can help you by facilitating care more promptly, often allowing for earlier appointments, access to diagnostic testing, procedures, and other specialty referrals.

## 2024-07-12 NOTE — Progress Notes (Signed)
 Chief Complaint:Barrett's esophagus with dysplasia  Primary GI Doctor: Dr. San   HPI:  Patient is a  58  year old male patient with past medical history of GERD, Barrett's esophagus, history of colonic polyps, who was referred to me by Jesus Bernardino MATSU, MD on 04/26/24 for a evaluation of Barrett's esophagus with dysplasia  .    Interval History     Patient presents for evaluation of GERD complicated with Barrett's and colon screening colonoscopy for history of colonic polyps.  Is currently taking omeprazole  20 mg p.o. daily for GERD.  Patient states this controls his symptoms most of the time. Patient recently has had some issues with seasonal allergies moving to [place]  with postnasal drip and globus sensation.  Patient has pending ENT referral.  Patient reports his bowel texture changes frequently. He has cut out caffeine which has made a difference. No abd pain or blood in stool.    Former smoker, he has a 14-year pack history with periods of cessation.  No alcohol use.   Patient on baby ASA 81mg  po daily.  Patients last EGD and colonoscopy was in 2020 with Dr. Rollin.   Surgical history: cyst removal  Patient's family history includes mother with colon polyps   Recently moved here from [place]. He is a Designer, jewellery.  Wt Readings from Last 3 Encounters:  07/12/24 177 lb (80.3 kg)  04/26/24 174 lb 12.8 oz (79.3 kg)  01/21/20 183 lb 3.2 oz (83.1 kg)     Past Medical History:  Diagnosis Date   Barrett's esophagus    CAD (coronary artery disease)    Mild atherosclerosis with 25-49% mild LAD and proxima RCA stenosis by coronary CTA 01/2020   Cold sore 10/07/2014   Depression    Elevated WBC count    h/o with neg w/u prev, resolved   GERD (gastroesophageal reflux disease)     Past Surgical History:  Procedure Laterality Date   chin implant     PILONIDAL CYST EXCISION      Current Outpatient Medications  Medication Sig Dispense Refill   ALPRAZolam  (XANAX) 1 MG tablet Take 0.5 mg by mouth at bedtime.      aspirin  EC 81 MG tablet Take 2 tablets (162 mg total) by mouth daily. Swallow whole. 180 tablet 3   buPROPion (WELLBUTRIN XL) 300 MG 24 hr tablet Take 300 mg by mouth daily.     chlorhexidine  (PERIDEX ) 0.12 % solution Use as directed 15 mLs in the mouth or throat 2 (two) times daily. 1893 mL 11   Multiple Vitamin (MULTIVITAMIN) tablet Take 1 tablet by mouth daily.     Na Sulfate-K Sulfate-Mg Sulfate concentrate (SUPREP) 17.5-3.13-1.6 GM/177ML SOLN Take 1 kit (354 mLs total) by mouth once for 1 dose. 354 mL 0   Omega-3 Fatty Acids (FISH OIL) 300 MG CAPS Fish oil     omeprazole  (PRILOSEC) 20 MG capsule Take 1 capsule (20 mg total) by mouth daily. 90 capsule 3   rosuvastatin  (CRESTOR ) 40 MG tablet Take 1 tablet (40 mg total) by mouth daily. Replaces atorvastatin  90 tablet 3   traZODone (DESYREL) 150 MG tablet Take one half tablet by mouth daily     No current facility-administered medications for this visit.    Allergies as of 07/12/2024 - Review Complete 07/12/2024  Allergen Reaction Noted   Doxycycline Hives 06/29/2013   Sulfa antibiotics Hives 06/29/2013   Varenicline  07/29/2014   Erythromycin Dermatitis and Rash 02/29/2024   Latex Dermatitis and  Rash 02/29/2024    Family History  Problem Relation Age of Onset   Depression Mother    Stroke Mother    COPD Mother    Alcohol abuse Father    Heart disease Father        early CAD on autopsy   Colon cancer Neg Hx    Prostate cancer Neg Hx     Review of Systems:    Constitutional: No weight loss, fever, chills, weakness or fatigue HEENT: Eyes: No change in vision               Ears, Nose, Throat:  No change in hearing or congestion Skin: No rash or itching Cardiovascular: No chest pain, chest pressure or palpitations   Respiratory: No SOB or cough Gastrointestinal: See HPI and otherwise negative Genitourinary: No dysuria or change in urinary frequency Neurological: No  headache, dizziness or syncope Musculoskeletal: No new muscle or joint pain Hematologic: No bleeding or bruising Psychiatric: No history of depression or anxiety    Physical Exam:  Vital signs: BP 114/70   Pulse 82   Ht 5' 9 (1.753 m)   Wt 177 lb (80.3 kg)   BMI 26.14 kg/m   Constitutional:   Pleasant  male appears to be in NAD, Well developed, Well nourished, alert and cooperative Throat: Oral cavity and pharynx without inflammation, swelling or lesion.  Respiratory: Respirations even and unlabored. Lungs clear to auscultation bilaterally.   No wheezes, crackles, or rhonchi.  Cardiovascular: Normal S1, S2. Regular rate and rhythm. No peripheral edema, cyanosis or pallor.  Gastrointestinal:  Soft, nondistended, nontender. No rebound or guarding. Normal bowel sounds. No appreciable masses or hepatomegaly. Rectal:  Not performed.  Msk:  Symmetrical without gross deformities. Without edema, no deformity or joint abnormality.  Neurologic:  Alert and  oriented x4;  grossly normal neurologically.  Skin:   Dry and intact without significant lesions or rashes.  RELEVANT LABS AND IMAGING: CBC    Latest Ref Rng & Units 07/04/2017    3:02 PM 09/24/2008    7:50 PM  CBC  WBC 3.8 - 10.8 K/uL 8.8  13.1   Hemoglobin 13.2 - 17.1 g/dL 84.9  83.7   Hematocrit 38.5 - 50.0 % 44.2  46.7   Platelets 140 - 400 K/uL 216  240      CMP     Latest Ref Rng & Units 05/17/2020    9:09 AM 03/24/2020    9:18 AM 01/18/2020    7:38 AM  CMP  Total Protein 6.0 - 8.3 g/dL   7.3   Total Bilirubin 0.2 - 1.2 mg/dL   0.5   Alkaline Phos 39 - 117 U/L   70   AST 0 - 37 U/L   17   ALT 0 - 44 IU/L 25  54  32      Imaging: 02/24/24 CT CHEST WITHOUT CONTRAST LOW-DOSE FOR LUNG CANCER SCREENING  IMPRESSION: 1. Lung-RADS 2, benign appearance or behavior. Continue annual screening with low-dose chest CT without contrast in 12 months. 2. Age advanced 2 vessel coronary artery calcification. 3. Enlarged pulmonic trunk,  indicative of pulmonary arterial hypertension. 4.  Emphysema (ICD10-J43.9).  GI procedures: 11/07/2015 colonoscopy-prev done per Dr. Rollin with GI   11/06/2010 EGD with Dr Rollin The z line was irregular and noted at 43cm. Cold biopsies were obtained for the history of Barrett's esophagus. No other abnormalities were found in the upper GI tract. Retroflexed views revealed no abnormalities. The scope was withdrawn from  the patient and the procedure terminated. Path: Esophagus, lower bx Barretts mucosa No dysplasia identified  Assessment: Encounter Diagnoses  Name Primary?   Barrett's esophagus without dysplasia Yes   Gastroesophageal reflux disease, unspecified whether esophagitis present    Altered bowel habits    Bloating    Flatulence    History of colonic polyps      59 year old male patient who presents for evaluation of GERD complicated with Barrett's.  Patient reports last endoscopy was in 2020, request report.  Patient currently on daily PPI.  Patient inquires about considering esophageal ablation as a alternative treatment option.  Will go ahead and schedule upper GI endoscopy to evaluate and will discuss case with Dr. San.    Patient also has history of colonic polyps and due for colon anoscopy we will schedule in [facility] with Dr. San.  For the inconsistencies in stools we discussed following a high-fiber diet to see if this helps.  Will reevaluate after procedures.  Plan: - Continue omeprazole  20 mg p.o. daily -Follow-up with ENT referral as discussed -Schedule EGD in [facility] with Dr. San.The risks and benefits of EGD with possible biopsies and esophageal dilation were discussed with the patient who agrees to proceed. -Schedule for a colonoscopy in [facility] with Dr. San. The risks and benefits of colonoscopy with possible polypectomy / biopsies were discussed and the patient agrees to proceed.  -request procedure reports from Dr. Rollin  Thank you for the courtesy  of this consult. Please call me with any questions or concerns.   Vernisha Bacote, FNP-C [facility] 07/12/2024, 10:54 AM  Cc: Jesus Bernardino MATSU, MD

## 2024-07-15 ENCOUNTER — Ambulatory Visit: Admitting: Internal Medicine

## 2024-08-04 ENCOUNTER — Encounter (INDEPENDENT_AMBULATORY_CARE_PROVIDER_SITE_OTHER): Payer: Self-pay | Admitting: Otolaryngology

## 2024-08-04 ENCOUNTER — Ambulatory Visit (INDEPENDENT_AMBULATORY_CARE_PROVIDER_SITE_OTHER): Admitting: Otolaryngology

## 2024-08-04 VITALS — BP 134/79 | HR 85 | Ht 69.0 in | Wt 171.6 lb

## 2024-08-04 DIAGNOSIS — J342 Deviated nasal septum: Secondary | ICD-10-CM

## 2024-08-04 DIAGNOSIS — R0981 Nasal congestion: Secondary | ICD-10-CM | POA: Diagnosis not present

## 2024-08-04 DIAGNOSIS — D487 Neoplasm of uncertain behavior of other specified sites: Secondary | ICD-10-CM | POA: Diagnosis not present

## 2024-08-04 DIAGNOSIS — F17210 Nicotine dependence, cigarettes, uncomplicated: Secondary | ICD-10-CM | POA: Diagnosis not present

## 2024-08-04 DIAGNOSIS — J343 Hypertrophy of nasal turbinates: Secondary | ICD-10-CM

## 2024-08-04 DIAGNOSIS — J3489 Other specified disorders of nose and nasal sinuses: Secondary | ICD-10-CM | POA: Diagnosis not present

## 2024-08-04 DIAGNOSIS — R0683 Snoring: Secondary | ICD-10-CM | POA: Diagnosis not present

## 2024-08-04 DIAGNOSIS — D485 Neoplasm of uncertain behavior of skin: Secondary | ICD-10-CM | POA: Diagnosis not present

## 2024-08-04 DIAGNOSIS — L820 Inflamed seborrheic keratosis: Secondary | ICD-10-CM | POA: Diagnosis not present

## 2024-08-04 NOTE — Progress Notes (Signed)
 Dear Dr. Jesus, Here is my assessment for our mutual patient, Gregory Wagner. Thank you for allowing me the opportunity to care for your patient. Please do not hesitate to contact me should you have any other questions. Sincerely, Dr. Eldora Blanch  Otolaryngology Clinic Note  HISTORY: Gregory Wagner is a 58 y.o. male kindly referred by Dr. Jesus for evaluation of nasal obstruction, nasal septal deviation, snoring.   Initial visit (07/2024): He has nasal congestion and obstruction, which has been chronic and has been told he has a septal deviation. Right side bothers him more than the left. Been a life long thing - more of a mouth breather. Feels like it is getting worse. He has not tried any nasal medications except for afrin (when he flies) and claritin. Claritin used to help. Does not get frequent sinus infections, no discolored drainage from the nose, facial pressure/pain, sense of smell is somewhat diminished. Some PND. Has had some allergies - prior allergy testing outside the country allergic to dust mites and white ash.   Of note, he also has significant snoring - unclear how long this has been going on for. No sleep study. No previous sinonasal surgery.  Sleep study: no  GLP-1: no AP/AC: ASA 162  Tobacco: former, 14 pack year, quit EtOH: yes, prior  PMHx: Pulmonary HTN, MDD/GAD, GERD w/Barrett's Esophagus, CAD  RADIOGRAPHIC EVALUATION AND INDEPENDENT REVIEW OF OTHER RECORDS:: Dr. Jesus: noted snoring and nasal congestion, unclear if tried sprays; Dx: Deviated septum; Rx: ref to ENT CMP 11/26/2019: BUN/Cr 9/0.86  Past Medical History:  Diagnosis Date   Barrett's esophagus    CAD (coronary artery disease)    Mild atherosclerosis with 25-49% mild LAD and proxima RCA stenosis by coronary CTA 01/2020   Cold sore 10/07/2014   Depression    Elevated WBC count    h/o with neg w/u prev, resolved   GERD (gastroesophageal reflux disease)    Past Surgical History:  Procedure  Laterality Date   chin implant     PILONIDAL CYST EXCISION     Family History  Problem Relation Age of Onset   Depression Mother    Stroke Mother    COPD Mother    Alcohol abuse Father    Heart disease Father        early CAD on autopsy   Colon cancer Neg Hx    Prostate cancer Neg Hx    Social History   Tobacco Use   Smoking status: Every Day    Current packs/day: 1.00    Average packs/day: 1 pack/day for 34.0 years (34.0 ttl pk-yrs)    Types: Cigarettes   Smokeless tobacco: Never   Tobacco comments:    Working on stop smoking does not smoke pack a day no more.  Substance Use Topics   Alcohol use: Not Currently   Allergies  Allergen Reactions   Doxycycline Hives   Sulfa Antibiotics Hives   Varenicline     Intolerant, clearly worsened his mood intolerant   Erythromycin Dermatitis and Rash   Latex Dermatitis and Rash   Current Outpatient Medications  Medication Sig Dispense Refill   ALPRAZolam (XANAX) 1 MG tablet Take 0.5 mg by mouth at bedtime.      aspirin  EC 81 MG tablet Take 2 tablets (162 mg total) by mouth daily. Swallow whole. 180 tablet 3   buPROPion (WELLBUTRIN XL) 300 MG 24 hr tablet Take 300 mg by mouth daily.     chlorhexidine  (PERIDEX ) 0.12 % solution Use as directed  15 mLs in the mouth or throat 2 (two) times daily. 1893 mL 11   Multiple Vitamin (MULTIVITAMIN) tablet Take 1 tablet by mouth daily.     Omega-3 Fatty Acids (FISH OIL) 300 MG CAPS Fish oil     omeprazole  (PRILOSEC) 20 MG capsule Take 1 capsule (20 mg total) by mouth daily. 90 capsule 3   rosuvastatin  (CRESTOR ) 40 MG tablet Take 1 tablet (40 mg total) by mouth daily. Replaces atorvastatin  90 tablet 3   traZODone (DESYREL) 150 MG tablet Take one half tablet by mouth daily     No current facility-administered medications for this visit.   BP 134/79 (BP Location: Right Arm, Patient Position: Sitting)   Pulse 85   Ht 5' 9 (1.753 m)   Wt 171 lb 9.6 oz (77.8 kg)   SpO2 95%   BMI 25.34 kg/m    PHYSICAL EXAM:  BP 134/79 (BP Location: Right Arm, Patient Position: Sitting)   Pulse 85   Ht 5' 9 (1.753 m)   Wt 171 lb 9.6 oz (77.8 kg)   SpO2 95%   BMI 25.34 kg/m    Salient findings:  CN II-XII intact Bilateral EAC clear and TM intact with well pneumatized middle ear spaces Nose: Anterior rhinoscopy reveals septal deviation more caudally right, then left posterior septal spur, bilateral inferior turbinate hypertrophy.  Nasal endoscopy was indicated to better evaluate the nose and paranasal sinuses, given the patient's history and exam findings, and is detailed below. Dorsum relatively midline,  No lesions of oral cavity/oropharynx No obviously palpable neck masses/lymphadenopathy/thyromegaly No respiratory distress or stridor   PROCEDURE:  Prior to initiating any procedures, risks/benefits/alternatives were explained to the patient and verbal consent obtained. Diagnostic Nasal Endoscopy Pre-procedure diagnosis: Concern for nasal obstruction, nasal septal deviation Post-procedure diagnosis: same Indication: See pre-procedure diagnosis and physical exam above Complications: None apparent EBL: 0 mL Anesthesia: Lidocaine 4% and topical decongestant was topically sprayed in each nasal cavity  Description of Procedure:  Patient was identified. A rigid 30 degree endoscope was utilized to evaluate the sinonasal cavities, mucosa, sinus ostia and turbinates and septum.  Overall, signs of mucosal inflammation are not noted.  No mucopurulence, polyps, or masses noted.  Left septal spur with more caudal right septal spur, bilateral inferior turbinate hypertrophy.  Right Middle meatus: clear Right SE Recess: clear Left MM: clear Left SE Recess: clear  Photodocumentation was obtained.  CPT CODE -- 31231 - Mod 25   ASSESSMENT:  58 y.o. with:  1. Nasal septal deviation   2. Nasal obstruction   3. Nasal congestion   4. Hypertrophy of both inferior nasal turbinates   5. Snoring     We discussed difference between snoring and OSA v/s nasal obstruction. I explained that nasal surgery does not cure sleep apnea and only in rare cases.  He reports he does not want to do anything about his snoring/OSA if it means sleep study/CPAP route (which is gold standard). For his nose, he clearly has a structural problem for which we discussed management - septal deviation, inferior turbinate hypertrophy We discussed the goals of septoplasty and turbinate reduction, and expectations for postoperative management. Will plan to leave splints in place, and removal was also discussed. We also discussed nasal obstruction post-operatively until splints in place and pain management.  We discussed R/B/A including pain, infection, bleeding (~2% risk of operative visit for control), persistent symptoms, need for revision surgery, and other risks including damage to surrounding structures, septal perforation, anesthetic complications, among others.  We discussed use of nasal saline spray and flonase post-operatively  PLAN: We've discussed issues and options today.  We reviewed the nasal endoscopy images together.  The risks, benefits and alternatives were discussed and questions answered.    Schedule for septo/turbs  Pre-op phone visit 1 week prior Will need to stop ASA 162 1 week prior to surgery to reduce bleeding risk  See below regarding exact medications prescribed this encounter including dosages and route: No orders of the defined types were placed in this encounter.    Thank you for allowing me the opportunity to care for your patient. Please do not hesitate to contact me should you have any other questions.  Sincerely, Eldora Blanch, MD Otolaryngologist (ENT), [facility] Phone: [phone] Fax: [phone]  MDM:  Level 4: [phone] Complexity/Problems addressed: mod - chronic problems with exacerbation Data complexity: low - independent review of notes, labs -  Morbidity: mod - decision for surgery - Prescription Drug prescribed or managed:   08/04/2024, 8:49 AM

## 2024-08-12 ENCOUNTER — Ambulatory Visit: Admitting: Internal Medicine

## 2024-08-12 ENCOUNTER — Encounter: Payer: Self-pay | Admitting: Internal Medicine

## 2024-08-12 VITALS — BP 118/80 | HR 75 | Temp 98.2°F | Ht 69.0 in | Wt 176.0 lb

## 2024-08-12 DIAGNOSIS — Z87891 Personal history of nicotine dependence: Secondary | ICD-10-CM

## 2024-08-12 DIAGNOSIS — G4733 Obstructive sleep apnea (adult) (pediatric): Secondary | ICD-10-CM | POA: Diagnosis not present

## 2024-08-12 NOTE — Progress Notes (Signed)
 [facility] Pulmonary Medicine Consultation      Date: 08/12/2024,   MRN# [id number] Gregory Wagner [birth year]     CHIEF COMPLAINT:   Assessment of abnormal CT chest Assessment of sleep apnea  HISTORY OF PRESENT ILLNESS   Patient assessed for OSA Patient would not want to be tested at this time as he would NOT want to pursue any type of therapy      08/12/2024    1:00 PM  Results of the Epworth flowsheet  Sitting and reading 0  Watching TV 1  Sitting, inactive in a public place (e.g. a theatre or a meeting) 0  As a passenger in a car for an hour without a break 0  Lying down to rest in the afternoon when circumstances permit 0  Sitting and talking to someone 0  Sitting quietly after a lunch without alcohol 0  In a car, while stopped for a few minutes in traffic 0  Total score 1    Patient with abnormal CT chest with bilateral upper lobe predominant emphysematous changes Patient with a history of extensive smoking history 1 pack a day for 40 years Patient enrolled in lung cancer screening program  No exacerbation at this time No evidence of heart failure at this time No evidence or signs of infection at this time No respiratory distress No fevers, chills, nausea, vomiting, diarrhea No evidence of lower extremity edema No evidence hemoptysis  Patient does moderate amount of activity with moderate exercise no significant respiratory compromise Patient does not get short of breath no dyspnea on exertion No cough no wheeze   Findings reviewed in detail with patient   PAST MEDICAL HISTORY   Past Medical History:  Diagnosis Date  . Barrett's esophagus   . CAD (coronary artery disease)    Mild atherosclerosis with 25-49% mild LAD and proxima RCA stenosis by coronary CTA 01/2020  . Cold sore 10/07/2014  . Depression   . Elevated WBC count    h/o with neg w/u prev, resolved  . GERD (gastroesophageal reflux disease)      SURGICAL HISTORY   Past Surgical  History:  Procedure Laterality Date  . chin implant    . PILONIDAL CYST EXCISION       FAMILY HISTORY   Family History  Problem Relation Age of Onset  . Depression Mother   . Stroke Mother   . COPD Mother   . Alcohol abuse Father   . Heart disease Father        early CAD on autopsy  . Colon cancer Neg Hx   . Prostate cancer Neg Hx      SOCIAL HISTORY   Social History   Tobacco Use  . Smoking status: Every Day    Current packs/day: 1.00    Average packs/day: 1 pack/day for 34.0 years (34.0 ttl pk-yrs)    Types: Cigarettes  . Smokeless tobacco: Never  . Tobacco comments:    Working on stop smoking does not smoke pack a day no more.  Substance Use Topics  . Alcohol use: Not Currently  . Drug use: No     MEDICATIONS    Home Medication:  Current Outpatient Rx  . Order #: [id number] Class: Historical Med  . Order #: [id number] Class: Historical Med  . Order #: [id number] Class: Print  . Order #: [id number] Class: Historical Med  . Order #: [id number] Class: Print  . Order #: [id number] Class: Historical Med  . Order #: [id number] Class: Historical Med  . Order #: [id number] Class: Normal  .  Order #: [id number] Class: Print  . Order #: [id number] Class: Historical Med    Current Medication:  Current Outpatient Medications:  .  bismuth subsalicylate (PEPTO BISMOL) 262 MG chewable tablet, Chew 262 mg by mouth 4 (four) times daily -  before meals and at bedtime., Disp: , Rfl:  .  ALPRAZolam (XANAX) 1 MG tablet, Take 0.5 mg by mouth at bedtime. , Disp: , Rfl:  .  aspirin  EC 81 MG tablet, Take 2 tablets (162 mg total) by mouth daily. Swallow whole., Disp: 180 tablet, Rfl: 3 .  buPROPion (WELLBUTRIN XL) 300 MG 24 hr tablet, Take 300 mg by mouth daily., Disp: , Rfl:  .  chlorhexidine  (PERIDEX ) 0.12 % solution, Use as directed 15 mLs in the mouth or throat 2 (two) times daily., Disp: 1893 mL, Rfl: 11 .  Multiple Vitamin (MULTIVITAMIN) tablet, Take 1 tablet by mouth daily., Disp: , Rfl:  .   Omega-3 Fatty Acids (FISH OIL) 300 MG CAPS, Fish oil, Disp: , Rfl:  .  omeprazole  (PRILOSEC) 20 MG capsule, Take 1 capsule (20 mg total) by mouth daily., Disp: 90 capsule, Rfl: 3 .  rosuvastatin  (CRESTOR ) 40 MG tablet, Take 1 tablet (40 mg total) by mouth daily. Replaces atorvastatin , Disp: 90 tablet, Rfl: 3 .  traZODone (DESYREL) 150 MG tablet, Take one half tablet by mouth daily, Disp: , Rfl:     ALLERGIES   Doxycycline, Sulfa antibiotics, Varenicline, Erythromycin, and Latex  BP 118/80   Pulse 75   Temp 98.2 F (36.8 C)   Ht 5' 9 (1.753 m)   Wt 176 lb (79.8 kg)   SpO2 96%   BMI 25.99 kg/m    Review of Systems: Gen:  Denies  fever, sweats, chills weight loss  HEENT: Denies blurred vision, double vision, ear pain, eye pain, hearing loss, nose bleeds, sore throat Cardiac:  No dizziness, chest pain or heaviness, chest tightness,edema, No JVD Resp:   No cough, -sputum production, -shortness of breath,-wheezing, -hemoptysis,  Other:  All other systems negative   Physical Examination:   General Appearance: No distress  EYES PERRLA, EOM intact.   NECK Supple, No JVD Pulmonary: normal breath sounds, No wheezing.  CardiovascularNormal S1,S2.  No m/r/g.   Abdomen: Benign, Soft, non-tender. Neurology UE/LE 5/5 strength, no focal deficits Ext pulses intact, cap refill intact ALL OTHER ROS ARE NEGATIVE     ASSESSMENT/PLAN   58 year old pleasant white male seen today for assessment of abnormal CT chest with extensive smoking history with enrolled in the lung cancer screening program findings are consistent with bilateral upper lobe predominant emphysematous changes however at this time patient does not have any significant amount of respiratory compromise  I do recommend pulmonary function testing as a baseline test I do not recommend any type of inhaler therapy at this time Continue to be active and do exercise as tolerated  Avoid Allergens and Irritants Avoid secondhand  smoke Avoid SICK contacts Recommend  Masking  when appropriate Recommend Keep up-to-date with vaccinations  Follow-up annual lung cancer screening program   CURRENT MEDICATIONS REVIEWED AT LENGTH WITH PATIENT TODAY   Patient  satisfied with Plan of action and management. All questions answered   Follow up 6 months   I spent a total of 62 minutes dedicated to the care of this patient on the date of this encounter to include pre-visit review of records, face-to-face time with the patient discussing conditions above, post visit ordering of testing, clinical documentation with the electronic health record, making appropriate  referrals as documented, and communicating necessary information to the patient's healthcare team.    The Patient requires high complexity decision making for assessment and support, frequent evaluation and titration of therapies, application of advanced monitoring technologies and extensive interpretation of multiple databases.  Patient satisfied with Plan of action and management. All questions answered    Nickolas Alm Cellar, M.D.  [facility]  Medical Director [facility] Medical Director [facility] Cardio-Pulmonary Department

## 2024-08-12 NOTE — Patient Instructions (Signed)
 Recommend annual low-dose CT scan for lung cancer screening Avoid Allergens and Irritants Avoid secondhand smoke Avoid SICK contacts Recommend  Masking  when appropriate Recommend Keep up-to-date with vaccinations  Recommend pulmonary function testing to assess lung for

## 2024-08-18 ENCOUNTER — Encounter: Payer: Self-pay | Admitting: Gastroenterology

## 2024-08-19 ENCOUNTER — Encounter: Payer: Self-pay | Admitting: Cardiology

## 2024-08-19 ENCOUNTER — Ambulatory Visit: Attending: Cardiology | Admitting: Cardiology

## 2024-08-19 VITALS — BP 136/88 | HR 84 | Ht 69.0 in | Wt 178.0 lb

## 2024-08-19 DIAGNOSIS — E782 Mixed hyperlipidemia: Secondary | ICD-10-CM

## 2024-08-19 DIAGNOSIS — I2729 Other secondary pulmonary hypertension: Secondary | ICD-10-CM | POA: Diagnosis not present

## 2024-08-19 DIAGNOSIS — I2510 Atherosclerotic heart disease of native coronary artery without angina pectoris: Secondary | ICD-10-CM

## 2024-08-19 NOTE — Patient Instructions (Signed)
  Testing/Procedures: Echocardiogram  Your physician has requested that you have an echocardiogram. Echocardiography is a painless test that uses sound waves to create images of your heart. It provides your doctor with information about the size and shape of your heart and how well your heart's chambers and valves are working. This procedure takes approximately one hour. There are no restrictions for this procedure. Please do NOT wear cologne, perfume, aftershave, or lotions (deodorant is allowed). Please arrive 15 minutes prior to your appointment time.  Please note: We ask at that you not bring children with you during ultrasound (echo/ vascular) testing. Due to room size and safety concerns, children are not allowed in the ultrasound rooms during exams. Our front office staff cannot provide observation of children in our lobby area while testing is being conducted. An adult accompanying a patient to their appointment will only be allowed in the ultrasound room at the discretion of the ultrasound technician under special circumstances. We apologize for any inconvenience.   Follow-Up: At [facility], you and your health needs are our priority.  As part of our continuing mission to provide you with exceptional heart care, our providers are all part of one team.  This team includes your primary Cardiologist (physician) and Advanced Practice Providers or APPs (Physician Assistants and Nurse Practitioners) who all work together to provide you with the care you need, when you need it.  Your next appointment:   As needed   Provider:   Newman JINNY Lawrence, MD

## 2024-08-19 NOTE — Progress Notes (Signed)
 Cardiology Office Note:  .   Date:  08/19/2024  ID:  Gregory Wagner, DOB Jun 25, 1966, MRN [id number] PCP: Jesus Bernardino MATSU, MD  [facility] Providers Cardiologist:  Newman Lawrence, MD PCP: Jesus Bernardino MATSU, MD  Chief Complaint  Patient presents with   Abnormal CT scan     Gregory Wagner is a 58 y.o. male with controlled hyperlipidemia, former smoker, coronary calcification  Discussed the use of AI scribe software for clinical note transcription with the patient, who gave verbal consent to proceed.  History of Present Illness Gregory Wagner is a 58 year old male with emphysema and coronary artery disease who presents for follow-up after a CT scan.  Patient underwent lung cancer CT chest which was reported as dilated pulmonary artery trunk.  Looking at the scans, the measurement was 30.96 mm.  I personally compared it with previous scans, and measurement nearly same at 30.7 mm.  He has a 35-year smoking history but quit over a year ago. He is concerned about potential blockages and recent CT findings. He has been informed of calcium  in his heart arteries, specifically in the LAD, but does not experience angina or exertional chest pain.  He engages in physical activity three to four times a week with five-minute walks. He experiences some shortness of breath upon exertion, attributed to emphysema, but no significant chest pain or shortness of breath during these activities.  His cholesterol levels have been good, with the last check in February showing a total cholesterol level in the 160s. He is on 40 mg of rosuvastatin , changed from atorvastatin , and has been taking aspirin  since his 30s due to a family history of thrombocytopenia.  He monitors his blood pressure at home, usually less than 130/80 mmHg, though it was elevated today due to stress and anxiety.      There were no vitals filed for this visit.    Review of Systems  Cardiovascular:  Positive for  dyspnea on exertion (Mild, stable.  Known emphysema). Negative for chest pain, leg swelling, palpitations and syncope.        Studies Reviewed: Gregory Wagner        EKG 08/19/2024: Normal sinus rhythm Normal ECG No previous ECGs available    CT chest 02/24/2024: 1. Lung-RADS 2, benign appearance or behavior. Continue annual screening with low-dose chest CT without contrast in 12 months. 2. Age advanced 2 vessel coronary artery calcification. 3. Enlarged pulmonic trunk, indicative of pulmonary arterial hypertension. 4.  Emphysema (ICD10-J43.9).  Labs 2021: Chol 115, TG 81, HDL 49, LDL 50    Physical Exam Vitals and nursing note reviewed.  Constitutional:      General: He is not in acute distress. Neck:     Vascular: No JVD.  Cardiovascular:     Rate and Rhythm: Normal rate and regular rhythm.     Heart sounds: Normal heart sounds. No murmur heard. Pulmonary:     Effort: Pulmonary effort is normal.     Breath sounds: Normal breath sounds. No wheezing or rales.  Musculoskeletal:     Right lower leg: No edema.     Left lower leg: No edema.      VISIT DIAGNOSES:   ICD-10-CM   1. Coronary artery disease involving native coronary artery of native heart without angina pectoris  I25.10 EKG 12-Lead    ECHOCARDIOGRAM COMPLETE    2. Other secondary pulmonary hypertension (HCC)  I27.29 ECHOCARDIOGRAM COMPLETE    3. Mixed hyperlipidemia  E78.2  Gregory Wagner is a 58 y.o. male with controlled hyperlipidemia, former smoker, coronary calcification  Assessment & Plan Abnormal CT scan: Urgent referral was for finding of dilated pulmonary artery trunk on CT chest which had raise suspicion of pulmonary artery hypertension.  I personally reviewed and compared recent CT chest with prior CT chest.  Pulmonary artery measurement has been nearly stable at just over 30 mm, which is upper limit normal, at best.  Also, it does not equate to pulmonary artery hypertension.  While pulmonary  hypertension cannot be excluded, my suspicion is very low.  I will obtain echocardiogram to close the loop on the question of pulmonary hypertension.  Coronary artery disease with coronary artery calcification Coronary artery calcification in LAD without significant luminal narrowing. No angina. LDL controlled at 50. - Continue aspirin  and rosuvastatin . - Emphasize heart-healthy diet and lifestyle changes, including Mediterranean-style diet.  Mixed hyperlipidemia: Hyperlipidemia well-managed with rosuvastatin . Total cholesterol around 160s, LDL at 50. - Continue rosuvastatin  40 mg daily. - Check cholesterol levels at upcoming physical.  Elevated blood pressure without diagnosis of hypertension: Generally, blood pressure is <130/80 mmHg.  Today's measurement is likely an outlier.  Do not recommend starting antihypertensive therapy at this time.  Recommend regular home monitoring.  If blood pressure consistently stays >130/80 mmHg, in spite of healthy lifestyle including regular walking, adequate sleep, low-salt, caffeine intake, we could then consider adding an antihypertensive agent.  Continue to follow with PCP.     No orders of the defined types were placed in this encounter.    F/u as needed  Signed, Newman JINNY Lawrence, MD

## 2024-08-20 ENCOUNTER — Ambulatory Visit (HOSPITAL_BASED_OUTPATIENT_CLINIC_OR_DEPARTMENT_OTHER)

## 2024-08-20 DIAGNOSIS — Z87891 Personal history of nicotine dependence: Secondary | ICD-10-CM

## 2024-08-20 LAB — PULMONARY FUNCTION TEST
DL/VA % pred: 93 %
DL/VA: 3.98 ml/min/mmHg/L
DLCO unc % pred: 93 %
DLCO unc: 25.17 ml/min/mmHg
FEF 25-75 Post: 2.82 L/s
FEF 25-75 Pre: 2.05 L/s
FEF2575-%Change-Post: 37 %
FEF2575-%Pred-Post: 96 %
FEF2575-%Pred-Pre: 69 %
FEV1-%Change-Post: 7 %
FEV1-%Pred-Post: 95 %
FEV1-%Pred-Pre: 88 %
FEV1-Post: 3.36 L
FEV1-Pre: 3.12 L
FEV1FVC-%Change-Post: -2 %
FEV1FVC-%Pred-Pre: 93 %
FEV6-%Change-Post: 8 %
FEV6-%Pred-Post: 108 %
FEV6-%Pred-Pre: 99 %
FEV6-Post: 4.81 L
FEV6-Pre: 4.42 L
FEV6FVC-%Change-Post: -1 %
FEV6FVC-%Pred-Post: 103 %
FEV6FVC-%Pred-Pre: 104 %
FVC-%Change-Post: 10 %
FVC-%Pred-Post: 105 %
FVC-%Pred-Pre: 95 %
FVC-Post: 4.87 L
FVC-Pre: 4.42 L
Post FEV1/FVC ratio: 69 %
Post FEV6/FVC ratio: 99 %
Pre FEV1/FVC ratio: 71 %
Pre FEV6/FVC Ratio: 100 %
RV % pred: 177 %
RV: 3.84 L
TLC % pred: 123 %
TLC: 8.39 L

## 2024-08-20 NOTE — Progress Notes (Signed)
 Full PFT performed today.

## 2024-08-20 NOTE — Patient Instructions (Signed)
 Full PFT performed today.

## 2024-08-23 ENCOUNTER — Telehealth: Payer: Self-pay | Admitting: *Deleted

## 2024-08-23 NOTE — Telephone Encounter (Signed)
 Spoke with pt and explained need for cardiac clearance prior to procedure. Understanding voiced.  ECHO is scheduled for 09-07-24- I asked pt to make sure to ask about clearance for this procedure when he receives ECHO results.  PV made for 09-22-24 and ECL rescheduled for 09-29-24

## 2024-08-23 NOTE — Telephone Encounter (Signed)
-----   Message from Sandor LULLA Flatter sent at 08/21/2024  6:45 AM EDT ----- Regarding: RE: endo colon 08/25/24 Walterine Guillaume,  I'm traveling currently, so just limited access to his records, but on first blush, I'd say call Monday to reschedule and let the cardiac work up take priority.   Thanks.  VC ----- Message ----- From: Domenic Harlene CROME, RN Sent: 08/20/2024   3:12 PM EDT To: Norleen Schillings, CRNA; Vito Cirigliano V, DO Subject: endo colon 08/25/24                             Hello,   I was prepping this pt's chart for next week and noticed that he saw Cardiology today as an urgent referral for finding of dilated pulmonary artery trunk on CT chest which had raise suspicion of pulmonary artery hypertension. An echocardiogram has been ordered but not yet completed. Is this pt still okay to have his procedure at [facility] on 10/8?  Thanks,   [facility]

## 2024-08-25 ENCOUNTER — Encounter: Admitting: Gastroenterology

## 2024-08-26 ENCOUNTER — Ambulatory Visit (INDEPENDENT_AMBULATORY_CARE_PROVIDER_SITE_OTHER): Admitting: Internal Medicine

## 2024-08-26 ENCOUNTER — Ambulatory Visit: Payer: Self-pay

## 2024-08-26 ENCOUNTER — Encounter: Payer: Self-pay | Admitting: Internal Medicine

## 2024-08-26 VITALS — BP 120/76 | HR 74 | Temp 98.2°F | Ht 69.0 in | Wt 177.4 lb

## 2024-08-26 DIAGNOSIS — R29818 Other symptoms and signs involving the nervous system: Secondary | ICD-10-CM

## 2024-08-26 DIAGNOSIS — Z125 Encounter for screening for malignant neoplasm of prostate: Secondary | ICD-10-CM

## 2024-08-26 DIAGNOSIS — I2510 Atherosclerotic heart disease of native coronary artery without angina pectoris: Secondary | ICD-10-CM | POA: Diagnosis not present

## 2024-08-26 DIAGNOSIS — J431 Panlobular emphysema: Secondary | ICD-10-CM

## 2024-08-26 DIAGNOSIS — I2584 Coronary atherosclerosis due to calcified coronary lesion: Secondary | ICD-10-CM

## 2024-08-26 DIAGNOSIS — Z0001 Encounter for general adult medical examination with abnormal findings: Secondary | ICD-10-CM

## 2024-08-26 DIAGNOSIS — H6121 Impacted cerumen, right ear: Secondary | ICD-10-CM

## 2024-08-26 DIAGNOSIS — Z87891 Personal history of nicotine dependence: Secondary | ICD-10-CM

## 2024-08-26 NOTE — Assessment & Plan Note (Signed)
 The ENT plans nasal surgery, potentially septoplasty, due to suspected sleep apnea. He does not use CPAP and has no current symptoms of atrial fibrillation related to sleep apnea. Confirm the surgery date and details with the ENT.

## 2024-08-26 NOTE — Assessment & Plan Note (Signed)
 Coronary artery disease is managed with aspirin  and cholesterol medications. The cardiologist advises that treatment remains the same regardless of blockage percentage unless symptoms develop. There are no current symptoms requiring intervention. Continue aspirin  and cholesterol medications, maintain a heart-healthy diet and moderate exercise, and monitor blood pressure to keep it under 130 mmHg.

## 2024-08-26 NOTE — Patient Instructions (Signed)
 VISIT SUMMARY: You had a follow-up visit to discuss your mild to moderate COPD, coronary artery disease, and other health concerns. Your recent pulmonary function tests show stable lung function, and you have no current respiratory symptoms. We also reviewed your cardiac health, potential sleep apnea, and earwax impaction. General health maintenance and lifestyle changes were also discussed.  YOUR PLAN: -CHRONIC OBSTRUCTIVE PULMONARY DISEASE (MILD TO MODERATE): Mild to moderate COPD means your lung function is slightly reduced but stable. Continue with annual lung cancer screenings using a CT scan and ensure your respiratory vaccinations, including RSV and pneumonia vaccines, are up-to-date.  -CORONARY ARTERY DISEASE: Coronary artery disease involves the narrowing of the heart's blood vessels. Continue taking aspirin  and cholesterol medications, maintain a heart-healthy diet, engage in moderate exercise, and monitor your blood pressure to keep it under 130 mmHg.  -SUSPECTED SLEEP APNEA: Sleep apnea is a condition where breathing repeatedly stops and starts during sleep. You may need nasal surgery, potentially septoplasty. Confirm the surgery date and details with your ENT specialist.  -EARWAX IMPACTION: Earwax impaction can cause hearing issues. Use a water spray or camera for self-removal, which you can purchase from [employer]. If self-removal is unsuccessful, seek assistance from a nurse.  -GENERAL HEALTH MAINTENANCE: It's important to keep your vaccinations and regular screenings up-to-date. Schedule blood work within the next week, including cholesterol and PSA screening, and maintain a healthy weight and lifestyle changes post-smoking cessation.  INSTRUCTIONS: Schedule blood work within the next week, including cholesterol and PSA screening. Confirm the date and details of your nasal surgery with your ENT specialist.  Building Your Long-Term Health Plan  During today's preventive visit, we  covered a variety of important health checks to help you stay on top of your well-being.  We also discussed strategies to maintain your health and identified some areas that might benefit from further exploration.   Preventive care visits like today's are designed to be proactive, but sometimes additional attention may be needed.  Rest assured, we're here for you.  If these areas require further evaluation or management, we'd be happy to schedule a separate, focused appointment to address them in detail.  Addressing Next Steps  [x]   Follow-up Visit: To ensure we address any unresolved issues and continue monitoring your overall health, we recommend scheduling a follow-up appointment in 1 year for your next preventive care visit. If you experience any new problems, need to discuss any medical concerns, or your condition worsens before then, please don't hesitate to call our office to schedule an appointment or seek emergency care as needed.  [x]   Preventive Measures: Maintaining healthy habits plays a crucial role in overall wellness. We recommend considering these tips: [x]   Regular appointments with dental and vision professionals [x]   Nightly nasal saline mist to keep sinuses clear [x]   Consistent toothbrushing to maintain oral health [x]   Using an app like [employer]oreLab to track sleep quality [x]   Routine checks of blood pressure and heart rate [x]   Medical Information: In some instances, we may require additional medical information from other providers to create a comprehensive picture of your health. If applicable, we can provide a medical information release form at the front desk for you to sign, allowing us  to gather these records. [x]   Lab Tests: If any lab tests were ordered today, scheduling them within a week of your visit helps ensure the best possible insurance coverage.  Planning Follow Up to Work on a Problem? Make the Most of Our Focused (20 minute)  Appointments  [x]   Clearly state  your top concerns at the beginning of the visit to focus our discussion [x]   If you anticipate you will need more time, please inform the front desk during scheduling - we can book multiple appointments in the same week. [x]   If you have transportation problems- use our convenient video appointments or ask about transportation support. [x]   We can get down to business faster if you use MyChart to update information before the visit and submit non-urgent questions before your visit. Thank you for taking the time to provide details through MyChart.  Let our nurse know and she can import this information into your encounter documents.  Arrival and Wait Times  [x]   Arriving on time ensures that everyone receives prompt attention. [x]   Early morning (8a) and afternoon (1p) appointments tend to have shortest wait times. [x]   Unfortunately, we cannot delay appointments for late arrivals or hold slots during phone calls.  Bring to Your Next Appointment:  [x]   Medications: Please bring all your medication bottles to your next appointment to ensure we have an accurate record of your prescriptions. [x]   Health Diaries: If you're monitoring any health conditions at home, keeping a diary of your readings can be very helpful for discussions at your next appointment.  Reviewing Your Records  [x]   Review your attached preventive care information at the end of these patient instructions. [x]   Review this early draft of your clinical encounter notes below and the final encounter summary tomorrow on MyChart after its been completed.      Getting Answers and Following Up  [x]   Simple Questions & Concerns: For quick questions or basic follow-up after your visit, reach us  at [phone] or MyChart messaging. [x]   Complex Concerns: If your concern is more complex, scheduling an appointment might be best. Discuss this with the staff to find the most suitable option. [x]   Lab & Imaging Results: We'll contact you  directly if results are abnormal or you don't use MyChart. Most normal results will be on MyChart within 2-3 business days, with a review message from Dr. Jesus. Haven't heard back in 2 weeks? Need results sooner? Contact us  at [phone]. [x]   Referrals: Our referral coordinator will manage specialist referrals. The specialist's office should contact you within 2 weeks to schedule an appointment. Call us  if you haven't heard from them after 2 weeks.  Staying Connected  [x]   MyChart: Activate your MyChart for the fastest way to access results and message us . See the last page of this paperwork for instructions on how to activate.  Billing  [x]   X-ray & Lab Orders: These are billed by separate companies. Contact the invoicing company directly for questions or concerns. [x]   Visit Charges: Discuss any billing inquiries with our administrative services team.  Your Satisfaction Matters  [x]   Share Your Experience: We strive for your satisfaction! If you have any complaints, or preferably compliments, please let Dr. Jesus know directly or contact our Practice Administrators, Manuelita Rubin or [employer], by asking at the front desk.                 Next Steps  [x]   Schedule Follow-Up:  We recommend a follow-up appointment in 1 year for your next wellness visit.  If you develop any new problems, want to address any medical issues, or your condition worsens before then, please call us  for an appointment or seek emergency care. [x]   Preventive Care:  Make  sure to keep regular appointments with dental and vision professionals, use nightly nasal saline mist sprays to keep your sinuses clear and toothbrushing to protect your teeth. Use [employer]oreLab App or other app to track your sleep quality. Check blood pressure and heart rate routinely. [x]   Medical Information Release:  For any relevant medical information we don't have, please sign a release form at the front desk so we can obtain  it for your records. [x]   Lab Tests:  Schedule any lab tests from today for within a week to ensure best insurance coverage.    Making the Most of Our Focused (20 minute) Appointments:  [x]   Clearly state your top concerns at the beginning of the visit to focus our discussion [x]   If you anticipate you will need more time, please inform the front desk during scheduling - we can book multiple appointments in the same week. [x]   If you have transportation problems- use our convenient video appointments or ask about transportation support. [x]   We can get down to business faster if you use MyChart to update information before the visit and submit non-urgent questions before your visit. Thank you for taking the time to provide details through MyChart.  Let our nurse know and she can import this information into your encounter documents.  Arrival and Wait Times: [x]   Arriving on time ensures that everyone receives prompt attention. [x]   Early morning (8a) and afternoon (1p) appointments tend to have shortest wait times. [x]   Unfortunately, we cannot delay appointments for late arrivals or hold slots during phone calls.  Bring to Your Next Appointment  [x]   Medications: Please bring all your medication bottles to your next appointment to ensure we have an accurate record of your prescriptions. [x]   Health Diaries: If you're monitoring any health conditions at home, keeping a diary of your readings can be very helpful for discussions at your next appointment.  Reviewing Your Records  [x]   Review your attached preventive care information at the end of these patient instructions. [x]   Review this early draft of your clinical encounter notes below and the final encounter summary tomorrow on MyChart after its been completed.   Encounter for annual general medical examination with abnormal findings in adult -     CBC with Differential/Platelet; Future -     Comprehensive metabolic panel with GFR;  Future -     Lipid panel; Future -     PSA; Future -     TSH Rfx on Abnormal to Free T4; Future  Suspected sleep apnea -     TSH Rfx on Abnormal to Free T4; Future  Coronary artery disease due to calcified coronary lesion -     CBC with Differential/Platelet; Future -     Comprehensive metabolic panel with GFR; Future -     Lipid panel; Future  History of smoking  Panlobular emphysema (HCC)  Screening for malignant neoplasm of prostate -     PSA; Future  Impacted cerumen of right ear     Getting Answers and Following Up  [x]   Simple Questions & Concerns: For quick questions or basic follow-up after your visit, reach us  at [phone] or MyChart messaging. [x]   Complex Concerns: If your concern is more complex, scheduling an appointment might be best. Discuss this with the staff to find the most suitable option. [x]   Lab & Imaging Results: We'll contact you directly if results are abnormal or you don't use MyChart. Most normal results will be  on MyChart within 2-3 business days, with a review message from Dr. Jesus. Haven't heard back in 2 weeks? Need results sooner? Contact us  at [phone]. [x]   Referrals: Our referral coordinator will manage specialist referrals. The specialist's office should contact you within 2 weeks to schedule an appointment. Call us  if you haven't heard from them after 2 weeks.  Staying Connected  [x]   MyChart: Activate your MyChart for the fastest way to access results and message us . See the last page of this paperwork for instructions on how to activate.  Billing  [x]   X-ray & Lab Orders: These are billed by separate companies. Contact the invoicing company directly for questions or concerns. [x]   Visit Charges: Discuss any billing inquiries with our administrative services team.  Your Satisfaction Matters  [x]   Share Your Experience: We strive for your satisfaction! If you have any complaints, or preferably compliments, please let Dr.  Jesus know directly or contact our Practice Administrators, Manuelita Rubin or [employer], by asking at the front desk.    Medical Screening Exam A medical screening exam (MSE) helps to determine whether you need immediate medical treatment relating to any number of symptoms you are having. This type of exam may be done in an emergency department, an urgent care setting, or your health care provider's office. Depending on your symptoms and severity, you may need additional tests or medical therapy. It is important to note that an MSE does not necessarily mean that you will need or receive further medical testing or interventions if your symptoms are not deemed to be medically urgent (emergent). Tell a health care provider about: Any allergies you have. All medicines you are taking, including vitamins, herbs, eye drops, creams, and over-the-counter medicines. Any problems you or family members have had with anesthetic medicines. Any bleeding problems you have. Any surgeries you have had. Any medical conditions you have. Whether you are pregnant or may be pregnant. What happens during the test? During the exam, a health care provider does a short, often focused, physical exam and asks about your medical history to assess: Your current symptoms. Your overall health. Your need for possible further medical intervention. What can I expect after the test? If you have a regular health care provider, make an appointment for a follow-up visit with him or her. If you do not have a regular health care provider, ask about resources in your community. Your medical screening exam may determine that: You do not need emergency treatment at this time. You need treatment right away. You need to be transferred to another medical center. This may happen if you need an emergent specialist or consultant that is not available at the medical center you are at. You need to have more tests. A medical specialist  may be consulted if needed. Get help right away if: Your condition gets worse. You develop new or troubling symptoms before you see your health care provider. These symptoms may represent a serious problem that is an emergency. Do not wait to see if the symptoms will go away. Get medical help right away. Call your local emergency services (911 in the [place] Do not drive yourself to the hospital. Summary A medical screening exam helps to determine whether you need medical treatment right away. This type of exam may be done in an emergency department, an urgent care setting, or your health care provider's office. During the exam, a health care provider does a short physical exam and asks about your current  symptoms and overall health. Depending on the exam, more tests or therapies may be ordered. However, an MSE does not necessarily mean that you will have further medical testing if your symptoms are not deemed to be urgent. If you need further care that is not offered at your current medical center, you may need to be transferred to another facility. This information is not intended to replace advice given to you by your health care provider. Make sure you discuss any questions you have with your health care provider. Document Revised: 07/18/2021 Document Reviewed: 03/15/2021 Elsevier Patient Education  2024 [employer].

## 2024-08-26 NOTE — Assessment & Plan Note (Signed)
 Mild to moderate COPD is confirmed by recent pulmonary function testing, with FEV1 at 88% of predicted, indicating mild COPD. A CT scan shows emphysema in the upper lobes, but lung function is not significantly impaired. The pulmonologist predicts slow progression due to smoking cessation. Continue annual lung cancer screening with a CT scan and ensure up-to-date respiratory vaccinations, including RSV and pneumonia vaccines.

## 2024-08-26 NOTE — Progress Notes (Signed)
 [facility] at [facility] [address] [place], [place] [address] Office:  [phone]  -- Annual Preventive Medical Office Visit --  Patient:  Gregory Wagner      Age: 58 y.o.       Sex:  male  Date:   08/26/2024 Patient Care Team: Jesus Bernardino MATSU, MD as PCP - General (Internal Medicine) Elmira Newman PARAS, MD as PCP - Cardiology (Cardiology) Vincente Grip, MD as Consulting Physician (Psychiatry) Today's Healthcare Provider: Bernardino MATSU Jesus, MD  ========================================= Chief complaint: Annual Exam (Pt is present for cpe he is not fasting)  Purpose of Visit: Comprehensive preventive health assessment and personalized health maintenance planning.  This encounter was conducted as a Comprehensive Physical Exam (CPE) preventive care annual visit. The patient's medical history and problem list were reviewed to inform individualized preventive care recommendations.   No problem-specific medical treatment was provided during this visit.  Assessment & Plan Encounter for annual general medical examination with abnormal findings in adult Screening for malignant neoplasm of prostate The importance of maintaining up-to-date vaccinations and regular screenings was discussed. He is encouraged to maintain healthy lifestyle changes post-smoking cessation. Schedule blood work within the next week, including cholesterol and PSA screening, and schedule a lab visit for fasting blood work. Maintain a healthy weight and lifestyle changes post-smoking cessation. Comprehensive preventive visit completed today. History, meds, allergies, immunizations, family and social risks reviewed; vitals and full exam documented. Counseling on nutrition, activity, sleep, mental health, sexual health, substance use, and safety. Vaccinations reconciled and updated per ACIP (influenza annually; COVID seasonal; Tdap q10y; shingles >=50; pneumococcal per age/comorbidity; Hep B as indicated).  Screening plan set per USPSTF: BP/BMI each visit; lipids/diabetes per risk; colorectal per modality/interval; cervical per age/history; breast by shared decision; low-dose CT annually, following with pulmonary.  AAA one-time for at-risk will defer to age 1. Labs today as indicated (CBC, CMP, lipids, A1c, TSH if symptomatic, HIV/HCV once, urine ACR if HTN/DM, others PRN). Follow-up annually or sooner for abnormalities. Suspected sleep apnea The ENT plans nasal surgery, potentially septoplasty, due to suspected sleep apnea. He does not use CPAP and has no current symptoms of atrial fibrillation related to sleep apnea. Confirm the surgery date and details with the ENT. Coronary artery disease due to calcified coronary lesion Coronary artery disease is managed with aspirin  and cholesterol medications. The cardiologist advises that treatment remains the same regardless of blockage percentage unless symptoms develop. There are no current symptoms requiring intervention. Continue aspirin  and cholesterol medications, maintain a heart-healthy diet and moderate exercise, and monitor blood pressure to keep it under 130 mmHg. History of smoking Panlobular emphysema (HCC) Mild to moderate COPD is confirmed by recent pulmonary function testing, with FEV1 at 88% of predicted, indicating mild COPD. A CT scan shows emphysema in the upper lobes, but lung function is not significantly impaired. The pulmonologist predicts slow progression due to smoking cessation. Continue annual lung cancer screening with a CT scan and ensure up-to-date respiratory vaccinations, including RSV and pneumonia vaccines. Impacted cerumen of right ear He reports earwax impaction causing hearing issues and is advised to use a water spray or camera for self-removal. Nurse assistance is available if needed. Purchase a water spray or camera for earwax removal from [employer] and consider nurse assistance if self-removal is unsuccessful.     ICD-10-CM    1. Encounter for annual general medical examination with abnormal findings in adult  Z00.01 CBC with Differential/Platelet    Comprehensive metabolic panel with  GFR    Lipid panel    PSA    TSH Rfx on Abnormal to Free T4    CANCELED: Lipid panel    CANCELED: Comprehensive metabolic panel with GFR    CANCELED: CBC with Differential/Platelet    CANCELED: TSH Rfx on Abnormal to Free T4    CANCELED: PSA    2. Suspected sleep apnea  R29.818 TSH Rfx on Abnormal to Free T4    CANCELED: TSH Rfx on Abnormal to Free T4    3. Coronary artery disease due to calcified coronary lesion  I25.10 CBC with Differential/Platelet   I25.84 Comprehensive metabolic panel with GFR    Lipid panel    CANCELED: Lipid panel    CANCELED: Comprehensive metabolic panel with GFR    CANCELED: CBC with Differential/Platelet    4. History of smoking  Z87.891     5. Panlobular emphysema (HCC)  J43.1     6. Screening for malignant neoplasm of prostate  Z12.5 PSA    CANCELED: PSA    7. Impacted cerumen of right ear  H61.21        Reviewed/updated/encouraged completion: Immunization History  Administered Date(s) Administered   Influenza, Mdck, Trivalent,PF 6+ MOS(egg free) 10/08/2023   Influenza,inj,Quad PF,6+ Mos 11/07/2015, 09/05/2018, 12/03/2019   Influenza,inj,quad, With Preservative 09/16/2014   Influenza,trivalent, recombinat, inj, PF 08/09/2024   Influenza-Unspecified 08/22/2017, 08/18/2018   Moderna Covid-19 Fall Seasonal Vaccine 56yrs & older 04/07/2023, 10/08/2023   PFIZER(Purple Top)SARS-COV-2 Vaccination 12/31/2019, 01/25/2020, 07/05/2020   PNEUMOCOCCAL CONJUGATE-20 11/06/2023   Pfizer Covid-19 Vaccine Bivalent Booster 74yrs & up 09/16/2021   Pneumococcal Conjugate-13 11/19/2011   Tdap 06/29/2013, 11/18/2023, 02/29/2024   Zoster Recombinant(Shingrix) 11/18/2023   Health Maintenance Due  Topic Date Due   Hepatitis B Vaccines 19-59 Average Risk (1 of 3 - 19+ 3-dose series) Never done   Zoster  Vaccines- Shingrix (2 of 2) 01/13/2024   COVID-19 Vaccine (7 - 2025-26 season) 07/19/2024   Health Maintenance  Topic Date Due   Hepatitis B Vaccines 19-59 Average Risk (1 of 3 - 19+ 3-dose series) Never done   Zoster Vaccines- Shingrix (2 of 2) 01/13/2024   COVID-19 Vaccine (7 - 2025-26 season) 07/19/2024   Lung Cancer Screening  02/23/2025   Colonoscopy  11/06/2025   DTaP/Tdap/Td (4 - Td or Tdap) 02/28/2034   Pneumococcal Vaccine: 50+ Years  Completed   Influenza Vaccine  Completed   Hepatitis C Screening  Completed   HIV Screening  Completed   HPV VACCINES  Aged Out   Meningococcal B Vaccine  Aged Out    Reviewed the following verbally with patient and provided AVS materials:   HEALTH MAINTENANCE COUNSELING AND ANTICIPATORY GUIDANCE    Preventive Measure Recommendation  Eye Exams Every 1-2 years  Dental Care Cleanings every 6 months or more, brush/floss 3x daily  Sinus Care Saline spray rinses daily  Sleep 8 hours nightly, good sleep hygiene, e-monitoring if any daytime drowsiness  Diet Fruits/vegetables/fiber/healthy fats, balance and moderation  Exercise 150 minutes weekly  Risk Behaviors Discouraged any/all high risk behaviors    CANCER SCREENING SHARED DECISION MAKING    Penile/Testicle/Scrotum Encouraged self-monitoring and reporting of genital abnormalities. Patient reports none.  Thyroid Checked and advised to palpate thyroid for nodules  Prostate Individualized risks/benefits/costs discussed No results found for: PSA  Colon HM Colonoscopy          Upcoming     Colonoscopy (Every 10 Years) Next due on 11/06/2025    11/07/2015  HM Colonoscopy component  of HM COLONOSCOPY   Only the first 1 history entries have been loaded, but more history exists.                Lung Current guidelines recommend individuals aged 1 to 9 who currently smoke or formerly smoked and have a >= 20 pack-year smoking history should undergo annual screening with low-dose  computed tomography (LDCT). Tobacco Use: Medium Risk (08/26/2024)   Patient History    Smoking Tobacco Use: Former    Smokeless Tobacco Use: Never    Passive Exposure: Not on file   Social History   Tobacco Use  Smoking Status Former   Current packs/day: 0.00   Average packs/day: 1 pack/day for 34.0 years (34.0 ttl pk-yrs)   Types: Cigarettes   Start date: 08/12/1989   Quit date: 08/13/2023   Years since quitting: 1.0  Smokeless Tobacco Never  Tobacco Comments   1 ppd at his heaviest -- 08/11/24    Skin Advised regular sunscreen use. Patient denies worrisome, changing, or new skin lesions. Offered to include images in chart for surveillance. Showed patient these pictures of melanomas for reference to educate for self-monitoring.  Other Cancers Discussed lack of screening guidelines and insurance coverage for other cancer types.    Discussed the use of AI scribe software for clinical note transcription with the patient, who gave verbal consent to proceed.  History of Present Illness  58 year old male with mild to moderate COPD who presents for a follow-up visit.  He has a history of emphysema in the upper lobes and mild to moderate COPD. After quitting smoking, he reports no current respiratory symptoms. Recent pulmonary function tests showed an FEV1 of 88% of the predicted amount. He has not required oxygen therapy.  He has a history of atrial fibrillation during nursing school, which was linked to high caffeine intake and stress. The condition resolved after reducing caffeine and using Xanax at night. No current symptoms of atrial fibrillation.  He is on aspirin  and cholesterol medication for cardiac health. He monitors his blood pressure, which is generally under 130 mmHg.  His current medications include Wellbutrin 450 mg, trazodone, and Xanax. He has discontinued bismuth and rosuvastatin . He also takes a multivitamin and omega-3 supplements.  He experiences dizziness and is  scheduled for an echocardiogram. He is also awaiting results from an endoscopy and colonoscopy, which were delayed pending the echocardiogram results.  He has experienced ear wax buildup causing hearing issues in the past, which he manages with home remedies. Recently, an insect bite on his ear caused swelling and redness, but it is improving.  He has gained ten pounds since moving home, attributing it to dietary indulgence and reduced physical activity due to hot weather. He is concerned about the weight gain but acknowledges it as a trade-off for quitting smoking.    ROS A comprehensive ROS was negative for any concerning symptoms.   Completed medication reconciliation: Current Outpatient Medications on File Prior to Visit  Medication Sig   ALPRAZolam (XANAX) 1 MG tablet Take 0.5 mg by mouth at bedtime.    aspirin  EC 81 MG tablet Take 2 tablets (162 mg total) by mouth daily. Swallow whole.   Multiple Vitamin (MULTIVITAMIN) tablet Take 1 tablet by mouth daily.   omeprazole  (PRILOSEC) 20 MG capsule Take 1 capsule (20 mg total) by mouth daily.   RETIN-A  0.1 % cream Apply topically at bedtime.   rosuvastatin  (CRESTOR ) 40 MG tablet Take 1 tablet (40 mg total) by mouth  daily. Replaces atorvastatin    traZODone (DESYREL) 150 MG tablet Take one half tablet by mouth daily   buPROPion (WELLBUTRIN XL) 150 MG 24 hr tablet Take 450 mg by mouth daily.   Omega-3 Fatty Acids (FISH OIL) 300 MG CAPS Fish oil   No current facility-administered medications on file prior to visit.   Medications Discontinued During This Encounter  Medication Reason   chlorhexidine  (PERIDEX ) 0.12 % solution Completed Course   bismuth subsalicylate (PEPTO BISMOL) 262 MG chewable tablet Completed Course   buPROPion (WELLBUTRIN XL) 300 MG 24 hr tablet   The following were reviewed and/or entered/updated into our electronic MEDICAL RECORD NUMBERPast Medical History:  Diagnosis Date   Barrett's esophagus    CAD (coronary artery  disease)    Mild atherosclerosis with 25-49% mild LAD and proxima RCA stenosis by coronary CTA 01/2020   Cold sore 10/07/2014   Depression    Elevated WBC count    h/o with neg w/u prev, resolved   GERD (gastroesophageal reflux disease)    Past Surgical History:  Procedure Laterality Date   chin implant     PILONIDAL CYST EXCISION     Social History   Socioeconomic History   Marital status: Married    Spouse name: Not on file   Number of children: Not on file   Years of education: Not on file   Highest education level: Bachelor's degree (e.g., BA, AB, BS)  Occupational History   Not on file  Tobacco Use   Smoking status: Former    Current packs/day: 0.00    Average packs/day: 1 pack/day for 34.0 years (34.0 ttl pk-yrs)    Types: Cigarettes    Start date: 08/12/1989    Quit date: 08/13/2023    Years since quitting: 1.0   Smokeless tobacco: Never   Tobacco comments:    1 ppd at his heaviest -- 08/11/24  Substance and Sexual Activity   Alcohol use: Not Currently   Drug use: No   Sexual activity: Not on file  Other Topics Concern   Not on file  Social History Narrative   Travel RN   Married 2015   Social Drivers of Health   Financial Resource Strain: Low Risk  (08/25/2024)   Overall Financial Resource Strain (CARDIA)    Difficulty of Paying Living Expenses: Not very hard  Food Insecurity: No Food Insecurity (08/25/2024)   Hunger Vital Sign    Worried About Running Out of Food in the Last Year: Never true    Ran Out of Food in the Last Year: Never true  Transportation Needs: No Transportation Needs (08/25/2024)   PRAPARE - Administrator, Civil Service (Medical): No    Lack of Transportation (Non-Medical): No  Physical Activity: Sufficiently Active (08/25/2024)   Exercise Vital Sign    Days of Exercise per Week: 3 days    Minutes of Exercise per Session: 50 min  Stress: No Stress Concern Present (08/25/2024)   [employer] of Occupational Health -  Occupational Stress Questionnaire    Feeling of Stress: Only a little  Social Connections: Moderately Integrated (08/25/2024)   Social Connection and Isolation Panel    Frequency of Communication with Friends and Family: More than three times a week    Frequency of Social Gatherings with Friends and Family: Once a week    Attends Religious Services: Never    Database administrator or Organizations: Yes    Attends Engineer, structural: More than 4 times per  year    Marital Status: Married  Catering manager Violence: Not on file      08/25/2024    1:50 PM  Alcohol Use Disorder Test (AUDIT)  1. How often do you have a drink containing alcohol? 0  3. How often do you have six or more drinks on one occasion? 0   Family History  Problem Relation Age of Onset   Depression Mother    Stroke Mother    COPD Mother    Alcohol abuse Father    Heart disease Father        early CAD on autopsy   Colon cancer Neg Hx    Prostate cancer Neg Hx    Allergies  Allergen Reactions   Doxycycline Hives   Sulfa Antibiotics Hives   Varenicline     Intolerant, clearly worsened his mood intolerant   Erythromycin Dermatitis and Rash   Latex Dermatitis and Rash   Social History   Substance and Sexual Activity  Sexual Activity Not on file  @    04/26/2024    9:09 AM  Depression screen PHQ 2/9  Decreased Interest 0  Down, Depressed, Hopeless 0  PHQ - 2 Score 0  Altered sleeping 0  Tired, decreased energy 0  Change in appetite 0  Feeling bad or failure about yourself  0  Trouble concentrating 0  Moving slowly or fidgety/restless 0  Suicidal thoughts 0  PHQ-9 Score 0  Difficult doing work/chores Not difficult at all       No data to display           BP 120/76   Pulse 74   Temp 98.2 F (36.8 C) (Temporal)   Ht 5' 9 (1.753 m)   Wt 177 lb 6.4 oz (80.5 kg)   SpO2 98%   BMI 26.20 kg/m  BP Readings from Last 3 Encounters:  08/26/24 120/76  08/19/24 136/88  08/12/24 118/80    Wt Readings from Last 10 Encounters:  08/26/24 177 lb 6.4 oz (80.5 kg)  08/19/24 178 lb (80.7 kg)  08/12/24 176 lb (79.8 kg)  08/04/24 171 lb 9.6 oz (77.8 kg)  07/12/24 177 lb (80.3 kg)  04/26/24 174 lb 12.8 oz (79.3 kg)  01/21/20 183 lb 3.2 oz (83.1 kg)  01/05/20 185 lb (83.9 kg)  12/03/19 186 lb (84.4 kg)  11/27/18 174 lb 12.8 oz (79.3 kg)  Physical Exam  Physical Exam GENERAL: Well-nourished, not overweight. HEENT: Nasal septum deviation observed. SKIN: No melanoma observed on skin, back skin clear.  GEN: No acute distress, resting comfortably. HEENT: Tympanic membranes normal appearing bilaterally, oropharynx clear, no thyromegaly noted, no palpable lymphadenopathy or thyroid nodules. CARDIOVASCULAR: S1 and S2 heart sounds with regular rate and rhythm, no murmurs appreciated. PULMONARY: Normal work of breathing, clear to auscultation bilaterally, no crackles, wheezes, or rhonchi. ABDOMEN: Soft, nontender, nondistended. MSK: No edema, cyanosis, or clubbing noted. SKIN: Warm, dry, no lesions of concern observed. NEUROLOGICAL: Cranial nerves II-XII grossly intact, strength 5/5 in upper and lower extremities, reflexes symmetric and intact bilaterally. PSYCH: Normal affect and thought content, pleasant and cooperative.      ======================================  IMPORTANT HEALTH REMINDERS: Report any new or changing skin lesions promptly Maintain recommended screening schedules Discuss any new family history of cancer at future visits Follow up on any new symptoms that persist more than two weeks      Notes:  This document was synthesized by artificial intelligence (Abridge) using HIPAA-compliant recording of the clinical interaction;   We  discussed the use of AI scribe software for clinical note transcription with the patient, who gave verbal consent to proceed.    This encounter employed state-of-the-art, real-time, collaborative documentation. The patient was  empowered to actively review and assist in updating their electronic medical record on a shared monitor, ensuring transparency and improving accuracy.    Prior to and at the beginning of Comprehensive Physical Exam (CPE) preventive care annual visit appointment types  we clarify to patients Our goal today is to focus on your preventive or annual Comprehensive Physical Exam (CPE) preventive care annual visit, which typically covers routine screenings and overall health maintenance. However, if you share any new or concerning symptoms--such as dizziness, passing out, severe pain, or anything else that may point to a more serious issue--we are both legally and ethically required to evaluate it. We cannot simply overlook or ignore such concerns, even if you later decide you don't want to discuss them, because it could jeopardize your health.  If addressing a new concern takes us  beyond the scope of the preventive visit, we may need to bill separately for that portion of care. We understand financial considerations are important, and we're happy to discuss your options if something new comes up. However, we want to be clear that once you mention a potentially serious issue, we must investigate it; we can't ethically or legally exclude that from our records or our evaluation. Please let us  know all of your questions or worries. Together, we can decide how best to manage them and how to minimize any unexpected costs, but we want to keep you safe above all else.   This disclosure is mandated by professional ethics and legal obligations, as healthcare providers must address any substantial health concerns raised during any patient interaction and a comprehensive ROS is required by insurance companies for billing preventive-care visit type.   This disclosure ultimately discourages patients financially from reporting significant health issues.   Medical Screening Exam A medical screening exam (MSE) helps to determine  whether you need immediate medical treatment relating to any number of symptoms you are having. This type of exam may be done in an emergency department, an urgent care setting, or your health care provider's office. Depending on your symptoms and severity, you may need additional tests or medical therapy. It is important to note that an MSE does not necessarily mean that you will need or receive further medical testing or interventions if your symptoms are not deemed to be medically urgent (emergent). Tell a health care provider about: Any allergies you have. All medicines you are taking, including vitamins, herbs, eye drops, creams, and over-the-counter medicines. Any problems you or family members have had with anesthetic medicines. Any bleeding problems you have. Any surgeries you have had. Any medical conditions you have. Whether you are pregnant or may be pregnant. What happens during the test? During the exam, a health care provider does a short, often focused, physical exam and asks about your medical history to assess: Your current symptoms. Your overall health. Your need for possible further medical intervention. What can I expect after the test? If you have a regular health care provider, make an appointment for a follow-up visit with him or her. If you do not have a regular health care provider, ask about resources in your community. Your medical screening exam may determine that: You do not need emergency treatment at this time. You need treatment right away. You need to be transferred to another medical center.  This may happen if you need an emergent specialist or consultant that is not available at the medical center you are at. You need to have more tests. A medical specialist may be consulted if needed. Get help right away if: Your condition gets worse. You develop new or troubling symptoms before you see your health care provider. These symptoms may represent a serious  problem that is an emergency. Do not wait to see if the symptoms will go away. Get medical help right away. Call your local emergency services (911 in the [place] Do not drive yourself to the hospital. Summary A medical screening exam helps to determine whether you need medical treatment right away. This type of exam may be done in an emergency department, an urgent care setting, or your health care provider's office. During the exam, a health care provider does a short physical exam and asks about your current symptoms and overall health. Depending on the exam, more tests or therapies may be ordered. However, an MSE does not necessarily mean that you will have further medical testing if your symptoms are not deemed to be urgent. If you need further care that is not offered at your current medical center, you may need to be transferred to another facility. This information is not intended to replace advice given to you by your health care provider. Make sure you discuss any questions you have with your health care provider. Document Revised: 07/18/2021 Document Reviewed: 03/15/2021 Elsevier Patient Education  2024 Elsevier Inc.   Health Maintenance, Male Adopting a healthy lifestyle and getting preventive care are important in promoting health and wellness. Ask your health care provider about: The right schedule for you to have regular tests and exams. Things you can do on your own to prevent diseases and keep yourself healthy. What should I know about diet, weight, and exercise? Eat a healthy diet  Eat a diet that includes plenty of vegetables, fruits, low-fat dairy products, and lean protein. Do not eat a lot of foods that are high in solid fats, added sugars, or sodium. Maintain a healthy weight Body mass index (BMI) is a measurement that can be used to identify possible weight problems. It estimates body fat based on height and weight. Your health care provider can help determine your BMI  and help you achieve or maintain a healthy weight. Get regular exercise Get regular exercise. This is one of the most important things you can do for your health. Most adults should: Exercise for at least 150 minutes each week. The exercise should increase your heart rate and make you sweat (moderate-intensity exercise). Do strengthening exercises at least twice a week. This is in addition to the moderate-intensity exercise. Spend less time sitting. Even light physical activity can be beneficial. Watch cholesterol and blood lipids Have your blood tested for lipids and cholesterol at 60 years of age, then have this test every 5 years. You may need to have your cholesterol levels checked more often if: Your lipid or cholesterol levels are high. You are older than 60 years of age. You are at high risk for heart disease. What should I know about cancer screening? Many types of cancers can be detected early and may often be prevented. Depending on your health history and family history, you may need to have cancer screening at various ages. This may include screening for: Colorectal cancer. Prostate cancer. Skin cancer. Lung cancer. What should I know about heart disease, diabetes, and high blood pressure? Blood pressure and  heart disease High blood pressure causes heart disease and increases the risk of stroke. This is more likely to develop in people who have high blood pressure readings or are overweight. Talk with your health care provider about your target blood pressure readings. Have your blood pressure checked: Every 3-5 years if you are 76-33 years of age. Every year if you are 70 years old or older. If you are between the ages of 81 and 56 and are a current or former smoker, ask your health care provider if you should have a one-time screening for abdominal aortic aneurysm (AAA). Diabetes Have regular diabetes screenings. This checks your fasting blood sugar level. Have the screening  done: Once every three years after age 57 if you are at a normal weight and have a low risk for diabetes. More often and at a younger age if you are overweight or have a high risk for diabetes. What should I know about preventing infection? Hepatitis B If you have a higher risk for hepatitis B, you should be screened for this virus. Talk with your health care provider to find out if you are at risk for hepatitis B infection. Hepatitis C Blood testing is recommended for: Everyone born from [birth year] through 1967. Anyone with known risk factors for hepatitis C. Sexually transmitted infections (STIs) You should be screened each year for STIs, including gonorrhea and chlamydia, if: You are sexually active and are younger than 60 years of age. You are older than 60 years of age and your health care provider tells you that you are at risk for this type of infection. Your sexual activity has changed since you were last screened, and you are at increased risk for chlamydia or gonorrhea. Ask your health care provider if you are at risk. Ask your health care provider about whether you are at high risk for HIV. Your health care provider may recommend a prescription medicine to help prevent HIV infection. If you choose to take medicine to prevent HIV, you should first get tested for HIV. You should then be tested every 3 months for as long as you are taking the medicine. Follow these instructions at home: Alcohol use Do not drink alcohol if your health care provider tells you not to drink. If you drink alcohol: Limit how much you have to 0-2 drinks a day. Know how much alcohol is in your drink. In the [place] one drink equals one 12 oz bottle of beer (355 mL), one 5 oz glass of wine (148 mL), or one 1 oz glass of hard liquor (44 mL). Lifestyle Do not use any products that contain nicotine or tobacco. These products include cigarettes, chewing tobacco, and vaping devices, such as e-cigarettes. If you need help  quitting, ask your health care provider. Do not use street drugs. Do not share needles. Ask your health care provider for help if you need support or information about quitting drugs. General instructions Schedule regular health, dental, and eye exams. Stay current with your vaccines. Tell your health care provider if: You often feel depressed. You have ever been abused or do not feel safe at home. Summary Adopting a healthy lifestyle and getting preventive care are important in promoting health and wellness. Follow your health care provider's instructions about healthy diet, exercising, and getting tested or screened for diseases. Follow your health care provider's instructions on monitoring your cholesterol and blood pressure. This information is not intended to replace advice given to you by your health care provider. Make  sure you discuss any questions you have with your health care provider. Document Revised: 03/26/2021 Document Reviewed: 03/26/2021 Elsevier Patient Education  2024 [employer].

## 2024-08-30 ENCOUNTER — Other Ambulatory Visit (INDEPENDENT_AMBULATORY_CARE_PROVIDER_SITE_OTHER)

## 2024-08-30 DIAGNOSIS — I2510 Atherosclerotic heart disease of native coronary artery without angina pectoris: Secondary | ICD-10-CM | POA: Diagnosis not present

## 2024-08-30 DIAGNOSIS — R29818 Other symptoms and signs involving the nervous system: Secondary | ICD-10-CM

## 2024-08-30 DIAGNOSIS — Z0001 Encounter for general adult medical examination with abnormal findings: Secondary | ICD-10-CM

## 2024-08-30 DIAGNOSIS — I2584 Coronary atherosclerosis due to calcified coronary lesion: Secondary | ICD-10-CM

## 2024-08-30 DIAGNOSIS — Z125 Encounter for screening for malignant neoplasm of prostate: Secondary | ICD-10-CM

## 2024-08-30 LAB — COMPREHENSIVE METABOLIC PANEL WITH GFR
ALT: 24 U/L (ref 0–53)
AST: 19 U/L (ref 0–37)
Albumin: 4.5 g/dL (ref 3.5–5.2)
Alkaline Phosphatase: 67 U/L (ref 39–117)
BUN: 8 mg/dL (ref 6–23)
CO2: 30 meq/L (ref 19–32)
Calcium: 9.2 mg/dL (ref 8.4–10.5)
Chloride: 97 meq/L (ref 96–112)
Creatinine, Ser: 0.87 mg/dL (ref 0.40–1.50)
GFR: 94.19 mL/min (ref >60.00)
Glucose, Bld: 101 mg/dL — ABNORMAL HIGH (ref 70–99)
Potassium: 4.5 meq/L (ref 3.5–5.1)
Sodium: 134 meq/L — ABNORMAL LOW (ref 135–145)
Total Bilirubin: 0.4 mg/dL (ref 0.2–1.2)
Total Protein: 6.5 g/dL (ref 6.0–8.3)

## 2024-08-30 LAB — LIPID PANEL
Cholesterol: 134 mg/dL (ref 0–200)
HDL: 75.3 mg/dL (ref >39.00)
LDL Cholesterol: 49 mg/dL (ref 0–99)
NonHDL: 58.55
Total CHOL/HDL Ratio: 2
Triglycerides: 47 mg/dL (ref 0.0–149.0)
VLDL: 9.4 mg/dL (ref 0.0–40.0)

## 2024-08-30 LAB — CBC WITH DIFFERENTIAL/PLATELET
Basophils Absolute: 0 K/uL (ref 0.0–0.1)
Basophils Relative: 0.5 % (ref 0.0–3.0)
Eosinophils Absolute: 0.2 K/uL (ref 0.0–0.7)
Eosinophils Relative: 2.8 % (ref 0.0–5.0)
HCT: 40 % (ref 39.0–52.0)
Hemoglobin: 13.6 g/dL (ref 13.0–17.0)
Lymphocytes Relative: 17.5 % (ref 12.0–46.0)
Lymphs Abs: 1.1 K/uL (ref 0.7–4.0)
MCHC: 34 g/dL (ref 30.0–36.0)
MCV: 89.6 fl (ref 78.0–100.0)
Monocytes Absolute: 0.4 K/uL (ref 0.1–1.0)
Monocytes Relative: 6.1 % (ref 3.0–12.0)
Neutro Abs: 4.7 K/uL (ref 1.4–7.7)
Neutrophils Relative %: 73.1 % (ref 43.0–77.0)
Platelets: 205 K/uL (ref 150.0–400.0)
RBC: 4.47 Mil/uL (ref 4.22–5.81)
RDW: 12.8 % (ref 11.5–15.5)
WBC: 6.4 K/uL (ref 4.0–10.5)

## 2024-08-30 LAB — PSA: PSA: 0.78 ng/mL (ref 0.10–4.00)

## 2024-08-31 ENCOUNTER — Ambulatory Visit: Payer: Self-pay | Admitting: Internal Medicine

## 2024-08-31 DIAGNOSIS — E871 Hypo-osmolality and hyponatremia: Secondary | ICD-10-CM | POA: Insufficient documentation

## 2024-08-31 LAB — TSH RFX ON ABNORMAL TO FREE T4: TSH: 0.895 u[IU]/mL (ref 0.450–4.500)

## 2024-09-01 ENCOUNTER — Encounter (HOSPITAL_BASED_OUTPATIENT_CLINIC_OR_DEPARTMENT_OTHER): Payer: Self-pay | Admitting: *Deleted

## 2024-09-01 NOTE — Progress Notes (Signed)
 Patient's chart and recent cardiology notes reviewed with Dr Lucious, [place] for [facility] pending ECHO results on 09-07-24.

## 2024-09-02 ENCOUNTER — Ambulatory Visit (INDEPENDENT_AMBULATORY_CARE_PROVIDER_SITE_OTHER): Admitting: Otolaryngology

## 2024-09-02 DIAGNOSIS — J3489 Other specified disorders of nose and nasal sinuses: Secondary | ICD-10-CM

## 2024-09-02 DIAGNOSIS — R0683 Snoring: Secondary | ICD-10-CM

## 2024-09-02 DIAGNOSIS — J343 Hypertrophy of nasal turbinates: Secondary | ICD-10-CM

## 2024-09-02 DIAGNOSIS — J342 Deviated nasal septum: Secondary | ICD-10-CM

## 2024-09-02 DIAGNOSIS — R0981 Nasal congestion: Secondary | ICD-10-CM

## 2024-09-03 ENCOUNTER — Telehealth: Payer: Self-pay

## 2024-09-03 NOTE — Telephone Encounter (Signed)
 Good Afternoon Dr. San,  I was prepping this patient's chart for a PV on 11/5 and an endocolon w/ you on 11/12 and noted he is scheduled for Septoplasty and Nasal Turbinate Reduction on 10/22.  This will put him at only 3 weeks post op for the endocolon.  Please advise if you would like to push the endocolon out Thank you, Opie Maclaughlin

## 2024-09-04 ENCOUNTER — Encounter (INDEPENDENT_AMBULATORY_CARE_PROVIDER_SITE_OTHER): Payer: Self-pay | Admitting: Otolaryngology

## 2024-09-04 NOTE — Progress Notes (Signed)
 Dear Dr. Jesus, Here is my assessment for our mutual patient, Gregory Wagner. Thank you for allowing me the opportunity to care for your patient. Please do not hesitate to contact me should you have any other questions. Sincerely, Dr. Eldora Blanch  Otolaryngology Clinic Note  HISTORY: Gregory Wagner is a 58 y.o. male kindly referred by Dr. Jesus for evaluation of nasal obstruction, nasal septal deviation, snoring.   Initial visit (07/2024): He has nasal congestion and obstruction, which has been chronic and has been told he has a septal deviation. Right side bothers him more than the left. Been a life long thing - more of a mouth breather. Feels like it is getting worse. He has not tried any nasal medications except for afrin (when he flies) and claritin. Claritin used to help. Does not get frequent sinus infections, no discolored drainage from the nose, facial pressure/pain, sense of smell is somewhat diminished. Some PND. Has had some allergies - prior allergy testing outside the country allergic to dust mites and white ash.   Of note, he also has significant snoring - unclear how long this has been going on for. No sleep study. No previous sinonasal surgery.  Sleep study: no --------------------------------------------------------- 09/02/2024 The patient gave consent to have this visit done by telemedicine / virtual visit, two identifiers were used to identify patient. This is also consent for access the chart and treat the patient via this visit. The patient is located in [place] .  I, the provider, am at the office.  We spent 10 minutes together for the visit and in preparation.  Joined by phone  No change in health. We again discussed R/B/A for surgery. Advised to stop ASA 1 week prior to surgery. Continues to have nasal congestion.     GLP-1: no AP/AC: ASA 162  Tobacco: former, 14 pack year, quit EtOH: yes, prior  PMHx: Pulmonary HTN, MDD/GAD, GERD w/Barrett's  Esophagus, CAD  RADIOGRAPHIC EVALUATION AND INDEPENDENT REVIEW OF OTHER RECORDS:: Dr. Jesus: noted snoring and nasal congestion, unclear if tried sprays; Dx: Deviated septum; Rx: ref to ENT CMP 11/26/2019: BUN/Cr 9/0.86  Past Medical History:  Diagnosis Date   Barrett's esophagus    CAD (coronary artery disease)    Mild atherosclerosis with 25-49% mild LAD and proxima RCA stenosis by coronary CTA 01/2020   Cold sore 10/07/2014   Complication of anesthesia    prolonged emergence   COPD (chronic obstructive pulmonary disease) (HCC)    Depression    Elevated WBC count    h/o with neg w/u prev, resolved   GERD (gastroesophageal reflux disease)    Hyperlipidemia    Past Surgical History:  Procedure Laterality Date   chin implant     COLONOSCOPY     PILONIDAL CYST EXCISION     Family History  Problem Relation Age of Onset   Depression Mother    Stroke Mother    COPD Mother    Alcohol abuse Father    Heart disease Father        early CAD on autopsy   Colon cancer Neg Hx    Prostate cancer Neg Hx    Social History   Tobacco Use   Smoking status: Former    Current packs/day: 0.00    Average packs/day: 1 pack/day for 34.0 years (34.0 ttl pk-yrs)    Types: Cigarettes    Start date: 08/12/1989    Quit date: 08/13/2023    Years since quitting: 1.0   Smokeless tobacco: Never  Tobacco comments:    1 ppd at his heaviest -- 08/11/24  Substance Use Topics   Alcohol use: Not Currently   Allergies  Allergen Reactions   Doxycycline Hives   Sulfa Antibiotics Hives   Varenicline     Intolerant, clearly worsened his mood intolerant   Erythromycin Dermatitis and Rash   Latex Dermatitis and Rash   Current Outpatient Medications  Medication Sig Dispense Refill   ALPRAZolam (XANAX) 1 MG tablet Take 0.5 mg by mouth at bedtime.      aspirin  EC 81 MG tablet Take 2 tablets (162 mg total) by mouth daily. Swallow whole. 180 tablet 3   buPROPion (WELLBUTRIN XL) 150 MG 24 hr tablet Take  450 mg by mouth daily.     Multiple Vitamin (MULTIVITAMIN) tablet Take 1 tablet by mouth daily.     Omega-3 Fatty Acids (FISH OIL) 300 MG CAPS Fish oil     omeprazole  (PRILOSEC) 20 MG capsule Take 1 capsule (20 mg total) by mouth daily. 90 capsule 3   RETIN-A  0.1 % cream Apply topically at bedtime.     rosuvastatin  (CRESTOR ) 40 MG tablet Take 1 tablet (40 mg total) by mouth daily. Replaces atorvastatin  90 tablet 3   traZODone (DESYREL) 150 MG tablet 150 mg. Take one half tablet by mouth daily     No current facility-administered medications for this visit.   There were no vitals taken for this visit.  PHYSICAL EXAM:  There were no vitals taken for this visit.   Salient findings:  Unable to perform formal physical exam because of phone visit  PROCEDURE (prior, not today):  Prior to initiating any procedures, risks/benefits/alternatives were explained to the patient and verbal consent obtained. Diagnostic Nasal Endoscopy Pre-procedure diagnosis: Concern for nasal obstruction, nasal septal deviation Post-procedure diagnosis: same Indication: See pre-procedure diagnosis and physical exam above Complications: None apparent EBL: 0 mL Anesthesia: Lidocaine 4% and topical decongestant was topically sprayed in each nasal cavity  Description of Procedure:  Patient was identified. A rigid 30 degree endoscope was utilized to evaluate the sinonasal cavities, mucosa, sinus ostia and turbinates and septum.  Overall, signs of mucosal inflammation are not noted.  No mucopurulence, polyps, or masses noted.  Left septal spur with more caudal right septal spur, bilateral inferior turbinate hypertrophy.  Right Middle meatus: clear Right SE Recess: clear Left MM: clear Left SE Recess: clear  Photodocumentation was obtained.  CPT CODE -- 31231 - Mod 25   ASSESSMENT:  58 y.o. with:  1. Nasal obstruction   2. Snoring   3. Nasal congestion   4. Hypertrophy of both inferior nasal turbinates   5.  Nasal septal deviation    We discussed difference between snoring and OSA v/s nasal obstruction. I explained that nasal surgery does not cure sleep apnea and only in rare cases.   He reports he does not want to do anything about his snoring/OSA if it means sleep study/CPAP route (which is gold standard).  For his nose, he clearly has a structural problem for which we discussed management - septal deviation, inferior turbinate hypertrophy  We again discussed the goals of septoplasty and turbinate reduction, and expectations for postoperative management. Will plan to leave splints in place, and removal was also discussed. We also discussed nasal obstruction post-operatively until splints in place and pain management.   He continues to wish to proceed. Hold ASA 162 prior to surgery for 1 week Cards and pulm wise he is doing realtively well and Pulm did  not recommend inhalers  See below regarding exact medications prescribed this encounter including dosages and route: No orders of the defined types were placed in this encounter.    Thank you for allowing me the opportunity to care for your patient. Please do not hesitate to contact me should you have any other questions.  Sincerely, Eldora Blanch, MD Otolaryngologist (ENT), [facility] Phone: [phone] Fax: [phone]  MDM:  [phone]  09/04/2024, 1:00 PM

## 2024-09-06 NOTE — Telephone Encounter (Signed)
 RN called patient to communicate that his endoscopic procedures will need to be delayed d/t scheduled septoplasty and nasal turbinate reduction on 10/22. Patient stated that he has cancelled the nasal surgeries. Appointments for patient at [facility] will remain in place. This was communicated with the patient. He stated understanding.

## 2024-09-07 ENCOUNTER — Ambulatory Visit: Payer: Self-pay | Admitting: Cardiology

## 2024-09-07 ENCOUNTER — Ambulatory Visit (HOSPITAL_COMMUNITY)
Admission: RE | Admit: 2024-09-07 | Discharge: 2024-09-07 | Disposition: A | Discharge status: Home / Self Care | Source: Ambulatory Visit | Attending: Cardiology | Admitting: Cardiology

## 2024-09-07 DIAGNOSIS — I2510 Atherosclerotic heart disease of native coronary artery without angina pectoris: Secondary | ICD-10-CM | POA: Diagnosis not present

## 2024-09-07 DIAGNOSIS — I2729 Other secondary pulmonary hypertension: Secondary | ICD-10-CM | POA: Diagnosis not present

## 2024-09-07 LAB — ECHOCARDIOGRAM COMPLETE
Area-P 1/2: 3.42 cm2
Est EF: >75
S' Lateral: 1.9 cm

## 2024-09-08 ENCOUNTER — Ambulatory Visit (HOSPITAL_BASED_OUTPATIENT_CLINIC_OR_DEPARTMENT_OTHER): Admission: RE | Admit: 2024-09-08 | Source: Home / Self Care

## 2024-09-08 DIAGNOSIS — Z01818 Encounter for other preprocedural examination: Secondary | ICD-10-CM

## 2024-09-08 HISTORY — DX: Other complications of anesthesia, initial encounter: T88.59XA

## 2024-09-08 HISTORY — DX: Hyperlipidemia, unspecified: E78.5

## 2024-09-08 HISTORY — DX: Chronic obstructive pulmonary disease, unspecified: J44.9

## 2024-09-08 SURGERY — SEPTOPLASTY, NOSE, WITH NASAL TURBINATE REDUCTION
Anesthesia: General | Laterality: Bilateral

## 2024-09-13 ENCOUNTER — Encounter (INDEPENDENT_AMBULATORY_CARE_PROVIDER_SITE_OTHER): Admitting: Otolaryngology

## 2024-09-14 DIAGNOSIS — F3342 Major depressive disorder, recurrent, in full remission: Secondary | ICD-10-CM | POA: Diagnosis not present

## 2024-09-14 DIAGNOSIS — F5101 Primary insomnia: Secondary | ICD-10-CM | POA: Diagnosis not present

## 2024-09-14 DIAGNOSIS — F410 Panic disorder [episodic paroxysmal anxiety] without agoraphobia: Secondary | ICD-10-CM | POA: Diagnosis not present

## 2024-09-22 ENCOUNTER — Ambulatory Visit (AMBULATORY_SURGERY_CENTER)

## 2024-09-22 VITALS — Ht 69.0 in | Wt 170.0 lb

## 2024-09-22 DIAGNOSIS — Z860100 Personal history of colon polyps, unspecified: Secondary | ICD-10-CM

## 2024-09-22 DIAGNOSIS — K2270 Barrett's esophagus without dysplasia: Secondary | ICD-10-CM

## 2024-09-22 NOTE — Progress Notes (Signed)

## 2024-09-23 DIAGNOSIS — H43393 Other vitreous opacities, bilateral: Secondary | ICD-10-CM | POA: Diagnosis not present

## 2024-09-29 ENCOUNTER — Encounter: Payer: Self-pay | Admitting: Gastroenterology

## 2024-09-29 ENCOUNTER — Ambulatory Visit: Admitting: Gastroenterology

## 2024-09-29 VITALS — BP 97/52 | HR 76 | Temp 98.2°F | Resp 20 | Ht 69.0 in | Wt 170.0 lb

## 2024-09-29 DIAGNOSIS — K2100 Gastro-esophageal reflux disease with esophagitis, without bleeding: Secondary | ICD-10-CM

## 2024-09-29 DIAGNOSIS — Z13810 Encounter for screening for upper gastrointestinal disorder: Secondary | ICD-10-CM | POA: Diagnosis not present

## 2024-09-29 DIAGNOSIS — K219 Gastro-esophageal reflux disease without esophagitis: Secondary | ICD-10-CM

## 2024-09-29 DIAGNOSIS — D123 Benign neoplasm of transverse colon: Secondary | ICD-10-CM | POA: Diagnosis not present

## 2024-09-29 DIAGNOSIS — K2270 Barrett's esophagus without dysplasia: Secondary | ICD-10-CM

## 2024-09-29 DIAGNOSIS — K635 Polyp of colon: Secondary | ICD-10-CM

## 2024-09-29 DIAGNOSIS — Z1211 Encounter for screening for malignant neoplasm of colon: Secondary | ICD-10-CM

## 2024-09-29 DIAGNOSIS — Z860100 Personal history of colon polyps, unspecified: Secondary | ICD-10-CM | POA: Diagnosis not present

## 2024-09-29 DIAGNOSIS — D122 Benign neoplasm of ascending colon: Secondary | ICD-10-CM

## 2024-09-29 DIAGNOSIS — K621 Rectal polyp: Secondary | ICD-10-CM

## 2024-09-29 DIAGNOSIS — D128 Benign neoplasm of rectum: Secondary | ICD-10-CM

## 2024-09-29 MED ORDER — SODIUM CHLORIDE 0.9 % IV SOLN: 500.0000 mL | INTRAVENOUS | Status: DC

## 2024-09-29 NOTE — Progress Notes (Signed)
 Pt's states no medical or surgical changes since previsit or office visit.

## 2024-09-29 NOTE — Op Note (Signed)
 [facility] Patient Name: Gregory Wagner Procedure Date: 09/29/2024 1:34 PM MRN: [id number] Endoscopist: Sandor Flatter , MD, [id number] Age: 58 Referring MD:  Date of Birth: 19-Sep-1966 Gender: Male Account #: [id number] Procedure:                Upper GI endoscopy Indications:              Esophageal reflux, Surveillance for malignancy due                            to personal history of Barrett's esophagus Medicines:                Monitored Anesthesia Care Procedure:                Pre-Anesthesia Assessment:                           - Prior to the procedure, a History and Physical                            was performed, and patient medications and                            allergies were reviewed. The patient's tolerance of                            previous anesthesia was also reviewed. The risks                            and benefits of the procedure and the sedation                            options and risks were discussed with the patient.                            All questions were answered, and informed consent                            was obtained. Prior Anticoagulants: The patient has                            taken no anticoagulant or antiplatelet agents. ASA                            Grade Assessment: II - A patient with mild systemic                            disease. After reviewing the risks and benefits,                            the patient was deemed in satisfactory condition to                            undergo the procedure.  After obtaining informed consent, the endoscope was                            passed under direct vision. Throughout the                            procedure, the patient's blood pressure, pulse, and                            oxygen saturations were monitored continuously. The                            Olympus Scope SN [id number] was introduced through the                             mouth, and advanced to the second part of duodenum.                            The upper GI endoscopy was accomplished without                            difficulty. The patient tolerated the procedure                            well. Scope In: Scope Out: Findings:                 One tongue of salmon-colored mucosa was present at                            37 cm. No other visible abnormalities were present.                            The maximum longitudinal extent of these esophageal                            mucosal changes was 0.5 cm in length. Biopsies were                            taken with a cold forceps for histology. Estimated                            blood loss was minimal.                           The upper third of the esophagus and middle third                            of the esophagus were normal.                           The gastroesophageal flap valve was visualized  endoscopically and classified as Hill Grade III                            (minimal fold, loose to endoscope, hiatal hernia                            likely).                           The entire examined stomach was normal.                           The examined duodenum was normal. Complications:            No immediate complications. Estimated Blood Loss:     Estimated blood loss was minimal. Impression:               - Salmon-colored mucosa. Biopsied.                           - Normal upper third of esophagus and middle third                            of esophagus.                           - Gastroesophageal flap valve classified as Hill                            Grade III (minimal fold, loose to endoscope, hiatal                            hernia likely).                           - Normal stomach.                           - Normal examined duodenum. Recommendation:           - Patient has a contact number available for                            emergencies.  The signs and symptoms of potential                            delayed complications were discussed with the                            patient. Return to normal activities tomorrow.                            Written discharge instructions were provided to the                            patient.                           -  Resume previous diet.                           - Continue present medications.                           - Await pathology results.                           - Repeat upper endoscopy for surveillance based on                            pathology results. Sandor Flatter, MD 09/29/2024 2:17:29 PM

## 2024-09-29 NOTE — Patient Instructions (Signed)
 Handouts given: Polyps Resume previous diet. Continue present medications.  Await pathology results. Repeat upper endoscopy and colonoscopy for surveillance based on pathology results. Return to GI clinic PRN.  YOU HAD AN ENDOSCOPIC PROCEDURE TODAY AT THE [facility]:   Refer to the procedure report that was given to you for any specific questions about what was found during the examination.  If the procedure report does not answer your questions, please call your gastroenterologist to clarify.  If you requested that your care partner not be given the details of your procedure findings, then the procedure report has been included in a sealed envelope for you to review at your convenience later.  YOU SHOULD EXPECT: Some feelings of bloating in the abdomen. Passage of more gas than usual.  Walking can help get rid of the air that was put into your GI tract during the procedure and reduce the bloating. If you had a lower endoscopy (such as a colonoscopy or flexible sigmoidoscopy) you may notice spotting of blood in your stool or on the toilet paper. If you underwent a bowel prep for your procedure, you may not have a normal bowel movement for a few days.  Please Note:  You might notice some irritation and congestion in your nose or some drainage.  This is from the oxygen used during your procedure.  There is no need for concern and it should clear up in a day or so.  SYMPTOMS TO REPORT IMMEDIATELY:  Following lower endoscopy (colonoscopy or flexible sigmoidoscopy):  Excessive amounts of blood in the stool  Significant tenderness or worsening of abdominal pains  Swelling of the abdomen that is new, acute  Fever of 100F or higher  Following upper endoscopy (EGD)  Vomiting of blood or coffee ground material  New chest pain or pain under the shoulder blades  Painful or persistently difficult swallowing  New shortness of breath  Fever of 100F or higher  Black, tarry-looking  stools  For urgent or emergent issues, a gastroenterologist can be reached at any hour by calling [phone]. Do not use MyChart messaging for urgent concerns.    DIET:  We do recommend a small meal at first, but then you may proceed to your regular diet.  Drink plenty of fluids but you should avoid alcoholic beverages for 24 hours.  ACTIVITY:  You should plan to take it easy for the rest of today and you should NOT DRIVE or use heavy machinery until tomorrow (because of the sedation medicines used during the test).    FOLLOW UP: Our staff will call the number listed on your records the next business day following your procedure.  We will call around 7:15- 8:00 am to check on you and address any questions or concerns that you may have regarding the information given to you following your procedure. If we do not reach you, we will leave a message.     If any biopsies were taken you will be contacted by phone or by letter within the next 1-3 weeks.  Please call us  at [phone] if you have not heard about the biopsies in 3 weeks.    SIGNATURES/CONFIDENTIALITY: You and/or your care partner have signed paperwork which will be entered into your electronic medical record.  These signatures attest to the fact that that the information above on your After Visit Summary has been reviewed and is understood.  Full responsibility of the confidentiality of this discharge information lies with you and/or your care-partner.

## 2024-09-29 NOTE — Progress Notes (Signed)
 A/o x 3, VSS, good SR's, pleased with anesthesia, report to RN

## 2024-09-29 NOTE — Progress Notes (Signed)
 GASTROENTEROLOGY PROCEDURE H&P NOTE   Primary Care Physician: Jesus Bernardino MATSU, MD    Reason for Procedure:  Barrett's Esophagus surveillance, GERD, colon polyp surveillance  Plan:    EGD, colonoscopy  Patient is appropriate for endoscopic procedure(s) in the ambulatory ([facility]) setting.  The nature of the procedure, as well as the risks, benefits, and alternatives were carefully and thoroughly reviewed with the patient. Ample time for discussion and questions allowed. The patient understood, was satisfied, and agreed to proceed. I personally addressed all patient questions and concerns.     HPI: Gregory Wagner is a 58 y.o. male who presents for EGD for evaluation of GERD and ongoing Barrett's Esophagus surveillance, along with colonoscopy for continued polyp surveillance.  Reflux currently treated with omeprazole  20 mg daily which controls his symptoms.  GI procedures: 11/07/2015 colonoscopy by Dr. Rollin.  No report available for review.   11/06/2010 EGD with Dr Rollin The z line was irregular and noted at 43cm. Cold biopsies were obtained for the history of Barrett's esophagus. No other abnormalities were found in the upper GI tract. Retroflexed views revealed no abnormalities. The scope was withdrawn from the patient and the procedure terminated. Path: Esophagus, lower bx Barretts mucosa No dysplasia identified  EGD 01/2008 with Barrett's Esophagus (only pathology report available for review)  Last EGD and colonoscopy 2020 at outside facility.  Past Medical History:  Diagnosis Date   Allergy    Barrett's esophagus    CAD (coronary artery disease)    Mild atherosclerosis with 25-49% mild LAD and proxima RCA stenosis by coronary CTA 01/2020   Cold sore 10/07/2014   Complication of anesthesia    prolonged emergence   COPD (chronic obstructive pulmonary disease) (HCC)    Depression    Elevated WBC count    h/o with neg w/u prev, resolved   GERD (gastroesophageal reflux  disease)    Hyperlipidemia     Past Surgical History:  Procedure Laterality Date   chin implant     COLONOSCOPY     PILONIDAL CYST EXCISION      Prior to Admission medications   Medication Sig Start Date End Date Taking? Authorizing Provider  ALPRAZolam (XANAX) 1 MG tablet Take 0.5 mg by mouth at bedtime.     [provider]  aspirin  EC 81 MG tablet Take 2 tablets (162 mg total) by mouth daily. Swallow whole. 04/26/24   Jesus Bernardino MATSU, MD  buPROPion (WELLBUTRIN XL) 150 MG 24 hr tablet Take 450 mg by mouth daily. 08/10/24   [provider]  Multiple Vitamin (MULTIVITAMIN) tablet Take 1 tablet by mouth daily.    [provider]  Omega-3 Fatty Acids (FISH OIL) 300 MG CAPS Fish oil    [provider]  omeprazole  (PRILOSEC) 20 MG capsule Take 1 capsule (20 mg total) by mouth daily. 04/26/24   Jesus Bernardino MATSU, MD  RETIN-A  0.1 % cream Apply topically at bedtime. 08/05/24   [provider]  rosuvastatin  (CRESTOR ) 40 MG tablet Take 1 tablet (40 mg total) by mouth daily. Replaces atorvastatin  04/26/24   Jesus Bernardino MATSU, MD  traZODone (DESYREL) 150 MG tablet 150 mg. Take one half tablet by mouth daily    [provider]    Current Outpatient Medications  Medication Sig Dispense Refill   ALPRAZolam (XANAX) 1 MG tablet Take 0.5 mg by mouth at bedtime.      aspirin  EC 81 MG tablet Take 2 tablets (162 mg total) by mouth daily. Swallow  whole. 180 tablet 3   buPROPion (WELLBUTRIN XL) 150 MG 24 hr tablet Take 450 mg by mouth daily.     Multiple Vitamin (MULTIVITAMIN) tablet Take 1 tablet by mouth daily.     Omega-3 Fatty Acids (FISH OIL) 300 MG CAPS Fish oil     omeprazole  (PRILOSEC) 20 MG capsule Take 1 capsule (20 mg total) by mouth daily. 90 capsule 3   RETIN-A  0.1 % cream Apply topically at bedtime.     rosuvastatin  (CRESTOR ) 40 MG tablet Take 1 tablet (40 mg total) by mouth daily. Replaces atorvastatin  90 tablet 3   traZODone (DESYREL) 150 MG  tablet 150 mg. Take one half tablet by mouth daily     No current facility-administered medications for this visit.    Allergies as of 09/29/2024 - Review Complete 09/22/2024  Allergen Reaction Noted   Doxycycline Hives 06/29/2013   Sulfa antibiotics Hives 06/29/2013   Varenicline  07/29/2014   Erythromycin Dermatitis and Rash 02/29/2024   Latex Dermatitis and Rash 02/29/2024    Family History  Problem Relation Age of Onset   Colon polyps Mother    Depression Mother    Stroke Mother    COPD Mother    Alcohol abuse Father    Heart disease Father        early CAD on autopsy   Colon cancer Neg Hx    Prostate cancer Neg Hx    Esophageal cancer Neg Hx    Rectal cancer Neg Hx    Stomach cancer Neg Hx     Social History   Socioeconomic History   Marital status: Married    Spouse name: Not on file   Number of children: Not on file   Years of education: Not on file   Highest education level: Bachelor's degree (e.g., BA, AB, BS)  Occupational History   Not on file  Tobacco Use   Smoking status: Former    Current packs/day: 0.00    Average packs/day: 1 pack/day for 34.0 years (34.0 ttl pk-yrs)    Types: Cigarettes    Start date: 08/12/1989    Quit date: 08/13/2023    Years since quitting: 1.1   Smokeless tobacco: Never   Tobacco comments:    1 ppd at his heaviest -- 08/11/24  Vaping Use   Vaping status: Never Used  Substance and Sexual Activity   Alcohol use: Not Currently   Drug use: No   Sexual activity: Not on file  Other Topics Concern   Not on file  Social History Narrative   Travel RN   Married 2015   Social Drivers of Health   Financial Resource Strain: Low Risk  (08/25/2024)   Overall Financial Resource Strain (CARDIA)    Difficulty of Paying Living Expenses: Not very hard  Food Insecurity: No Food Insecurity (08/25/2024)   Hunger Vital Sign    Worried About Running Out of Food in the Last Year: Never true    Ran Out of Food in the Last Year: Never true   Transportation Needs: No Transportation Needs (08/25/2024)   PRAPARE - Administrator, Civil Service (Medical): No    Lack of Transportation (Non-Medical): No  Physical Activity: Sufficiently Active (08/25/2024)   Exercise Vital Sign    Days of Exercise per Week: 3 days    Minutes of Exercise per Session: 50 min  Stress: No Stress Concern Present (08/25/2024)   [employer] of Occupational Health - Occupational Stress Questionnaire    Feeling of  Stress: Only a little  Social Connections: Moderately Integrated (08/25/2024)   Social Connection and Isolation Panel    Frequency of Communication with Friends and Family: More than three times a week    Frequency of Social Gatherings with Friends and Family: Once a week    Attends Religious Services: Never    Database Administrator or Organizations: Yes    Attends Engineer, Structural: More than 4 times per year    Marital Status: Married  Catering Manager Violence: Not on file    Physical Exam: Vital signs in last 24 hours: @There  were no vitals taken for this visit. GEN: NAD EYE: Sclerae anicteric ENT: MMM CV: Non-tachycardic Pulm: CTA b/l GI: Soft, NT/ND NEURO:  Alert & Oriented x 3   Sandor Flatter, DO [facility]   09/29/2024 12:43 PM

## 2024-09-29 NOTE — Op Note (Signed)
 [facility] Patient Name: Gregory Wagner Procedure Date: 09/29/2024 1:33 PM MRN: [id number] Endoscopist: Sandor Flatter , MD, [id number] Age: 58 Referring MD:  Date of Birth: August 20, 1966 Gender: Male Account #: [id number] Procedure:                Colonoscopy Indications:              High risk colon cancer surveillance: Personal                            history of colonic polyps Medicines:                Monitored Anesthesia Care Procedure:                Pre-Anesthesia Assessment:                           - Prior to the procedure, a History and Physical                            was performed, and patient medications and                            allergies were reviewed. The patient's tolerance of                            previous anesthesia was also reviewed. The risks                            and benefits of the procedure and the sedation                            options and risks were discussed with the patient.                            All questions were answered, and informed consent                            was obtained. Prior Anticoagulants: The patient has                            taken no anticoagulant or antiplatelet agents. ASA                            Grade Assessment: II - A patient with mild systemic                            disease. After reviewing the risks and benefits,                            the patient was deemed in satisfactory condition to                            undergo the procedure.  After obtaining informed consent, the colonoscope                            was passed under direct vision. Throughout the                            procedure, the patient's blood pressure, pulse, and                            oxygen saturations were monitored continuously. The                            CF [id number] #[id number] was introduced through the anus                            and advanced to the the terminal  ileum. The                            colonoscopy was performed without difficulty. The                            patient tolerated the procedure well. The quality                            of the bowel preparation was good. The terminal                            ileum, ileocecal valve, appendiceal orifice, and                            rectum were photographed. Scope In: 1:46:58 PM Scope Out: 2:10:47 PM Scope Withdrawal Time: 0 hours 18 minutes 24 seconds  Total Procedure Duration: 0 hours 23 minutes 49 seconds  Findings:                 The perianal and digital rectal examinations were                            normal.                           Two sessile polyps were found in the transverse                            colon and ascending colon. The polyps were 2 to 4                            mm in size. These polyps were removed with a cold                            snare. Resection and retrieval were complete.                            Estimated blood loss was minimal.  Four sessile polyps were found in the rectum. The                            polyps were 1 to 3 mm in size. These polyps were                            removed with a cold snare. Resection and retrieval                            were complete. Estimated blood loss was minimal.                           The retroflexed view of the distal rectum and anal                            verge was normal and showed no anal or rectal                            abnormalities.                           The terminal ileum appeared normal. Complications:            No immediate complications. Estimated Blood Loss:     Estimated blood loss was minimal. Impression:               - Two 2 to 4 mm polyps in the transverse colon and                            in the ascending colon, removed with a cold snare.                            Resected and retrieved.                           - Four 1 to 3 mm  polyps in the rectum, removed with                            a cold snare. Resected and retrieved.                           - The distal rectum and anal verge are normal on                            retroflexion view.                           - The examined portion of the ileum was normal. Recommendation:           - Patient has a contact number available for                            emergencies. The signs and symptoms of potential  delayed complications were discussed with the                            patient. Return to normal activities tomorrow.                            Written discharge instructions were provided to the                            patient.                           - Resume previous diet.                           - Continue present medications.                           - Await pathology results.                           - Repeat colonoscopy for surveillance based on                            pathology results.                           - Return to GI office PRN. Sandor Flatter, MD 09/29/2024 2:21:14 PM

## 2024-09-29 NOTE — Progress Notes (Signed)
 Called to room to assist during endoscopic procedure.  Patient ID and intended procedure confirmed with present staff. Received instructions for my participation in the procedure from the performing physician.

## 2024-09-30 ENCOUNTER — Telehealth: Payer: Self-pay

## 2024-09-30 NOTE — Telephone Encounter (Signed)
  Follow up Call-     09/29/2024   12:42 PM  Call back number  Post procedure Call Back phone  # [phone]  Permission to leave phone message Yes     Patient questions:  Do you have a fever, pain , or abdominal swelling? No. Pain Score  0 *  Have you tolerated food without any problems? Yes  Have you been able to return to your normal activities? Yes.    Do you have any questions about your discharge instructions: Diet   No. Medications  No. Follow up visit  No.  Do you have questions or concerns about your Care? No.  Actions: * If pain score is 4 or above: No action needed, pain <4.

## 2024-10-04 LAB — SURGICAL PATHOLOGY

## 2024-10-06 ENCOUNTER — Ambulatory Visit: Payer: Self-pay | Admitting: Gastroenterology

## 2025-09-07 ENCOUNTER — Encounter: Admitting: Internal Medicine
